# Patient Record
Sex: Female | Born: 1970 | Race: Black or African American | Hispanic: No | Marital: Married | Smoking: Never smoker
Health system: Southern US, Community
[De-identification: ages and names within clinical notes are randomized; demographics above are authoritative.]

## PROBLEM LIST (undated history)

## (undated) DIAGNOSIS — E119 Type 2 diabetes mellitus without complications: Secondary | ICD-10-CM

## (undated) DIAGNOSIS — L280 Lichen simplex chronicus: Secondary | ICD-10-CM

## (undated) DIAGNOSIS — Z794 Long term (current) use of insulin: Secondary | ICD-10-CM

## (undated) DIAGNOSIS — I10 Essential (primary) hypertension: Secondary | ICD-10-CM

## (undated) DIAGNOSIS — Z9641 Presence of insulin pump (external) (internal): Secondary | ICD-10-CM

## (undated) DIAGNOSIS — Z973 Presence of spectacles and contact lenses: Secondary | ICD-10-CM

## (undated) DIAGNOSIS — IMO0002 Reserved for concepts with insufficient information to code with codable children: Secondary | ICD-10-CM

## (undated) DIAGNOSIS — N840 Polyp of corpus uteri: Secondary | ICD-10-CM

## (undated) DIAGNOSIS — E282 Polycystic ovarian syndrome: Secondary | ICD-10-CM

## (undated) HISTORY — DX: Polycystic ovarian syndrome: E28.2

## (undated) HISTORY — DX: Essential (primary) hypertension: I10

## (undated) HISTORY — DX: Lichen simplex chronicus: L28.0

## (undated) HISTORY — DX: Reserved for concepts with insufficient information to code with codable children: IMO0002

---

## 1997-09-08 ENCOUNTER — Encounter: Admission: RE | Admit: 1997-09-08 | Discharge: 1997-12-07 | Payer: Self-pay | Admitting: Internal Medicine

## 1999-12-20 ENCOUNTER — Other Ambulatory Visit: Admission: RE | Admit: 1999-12-20 | Discharge: 1999-12-20 | Payer: Self-pay | Admitting: Gynecology

## 2000-01-23 ENCOUNTER — Other Ambulatory Visit: Admission: RE | Admit: 2000-01-23 | Discharge: 2000-01-23 | Payer: Self-pay | Admitting: Gynecology

## 2000-01-23 ENCOUNTER — Encounter (INDEPENDENT_AMBULATORY_CARE_PROVIDER_SITE_OTHER): Payer: Self-pay | Admitting: Specialist

## 2000-12-16 ENCOUNTER — Encounter: Admission: RE | Admit: 2000-12-16 | Discharge: 2001-03-16 | Payer: Self-pay | Admitting: Internal Medicine

## 2002-06-28 ENCOUNTER — Inpatient Hospital Stay (HOSPITAL_COMMUNITY): Admission: AD | Admit: 2002-06-28 | Discharge: 2002-06-28 | Payer: Self-pay | Admitting: *Deleted

## 2002-06-28 ENCOUNTER — Encounter (INDEPENDENT_AMBULATORY_CARE_PROVIDER_SITE_OTHER): Payer: Self-pay | Admitting: Specialist

## 2002-06-29 ENCOUNTER — Encounter: Payer: Self-pay | Admitting: *Deleted

## 2002-09-17 ENCOUNTER — Other Ambulatory Visit: Admission: RE | Admit: 2002-09-17 | Discharge: 2002-09-17 | Payer: Self-pay | Admitting: Obstetrics and Gynecology

## 2003-05-07 ENCOUNTER — Ambulatory Visit (HOSPITAL_COMMUNITY): Admission: RE | Admit: 2003-05-07 | Discharge: 2003-05-07 | Payer: Self-pay

## 2003-06-05 HISTORY — PX: MYOMECTOMY ABDOMINAL APPROACH: SUR870

## 2003-06-05 HISTORY — PX: HYSTEROSCOPY: SHX211

## 2003-09-22 ENCOUNTER — Encounter: Admission: RE | Admit: 2003-09-22 | Discharge: 2003-09-22 | Payer: Self-pay | Admitting: Endocrinology

## 2003-12-09 ENCOUNTER — Encounter: Admission: RE | Admit: 2003-12-09 | Discharge: 2003-12-09 | Payer: Self-pay | Admitting: Endocrinology

## 2004-06-14 ENCOUNTER — Other Ambulatory Visit: Admission: RE | Admit: 2004-06-14 | Discharge: 2004-06-14 | Payer: Self-pay | Admitting: Obstetrics and Gynecology

## 2005-01-01 ENCOUNTER — Other Ambulatory Visit: Admission: RE | Admit: 2005-01-01 | Discharge: 2005-01-01 | Payer: Self-pay | Admitting: Obstetrics and Gynecology

## 2005-05-21 ENCOUNTER — Inpatient Hospital Stay (HOSPITAL_COMMUNITY): Admission: RE | Admit: 2005-05-21 | Discharge: 2005-05-21 | Payer: Self-pay | Admitting: Gynecology

## 2005-05-23 ENCOUNTER — Inpatient Hospital Stay (HOSPITAL_COMMUNITY): Admission: RE | Admit: 2005-05-23 | Discharge: 2005-05-26 | Payer: Self-pay | Admitting: Gynecology

## 2005-05-23 ENCOUNTER — Encounter (INDEPENDENT_AMBULATORY_CARE_PROVIDER_SITE_OTHER): Payer: Self-pay | Admitting: Specialist

## 2005-05-27 ENCOUNTER — Encounter: Admission: RE | Admit: 2005-05-27 | Discharge: 2005-06-26 | Payer: Self-pay | Admitting: Gynecology

## 2005-07-10 ENCOUNTER — Other Ambulatory Visit: Admission: RE | Admit: 2005-07-10 | Discharge: 2005-07-10 | Payer: Self-pay | Admitting: Surgical Oncology

## 2006-07-25 ENCOUNTER — Other Ambulatory Visit: Admission: RE | Admit: 2006-07-25 | Discharge: 2006-07-25 | Payer: Self-pay | Admitting: Gynecology

## 2007-02-11 ENCOUNTER — Other Ambulatory Visit: Admission: RE | Admit: 2007-02-11 | Discharge: 2007-02-11 | Payer: Self-pay | Admitting: Gynecology

## 2007-12-01 ENCOUNTER — Other Ambulatory Visit: Admission: RE | Admit: 2007-12-01 | Discharge: 2007-12-01 | Payer: Self-pay | Admitting: Gynecology

## 2008-04-07 ENCOUNTER — Ambulatory Visit: Payer: Self-pay | Admitting: Internal Medicine

## 2008-04-07 DIAGNOSIS — I10 Essential (primary) hypertension: Secondary | ICD-10-CM | POA: Insufficient documentation

## 2008-04-07 DIAGNOSIS — R059 Cough, unspecified: Secondary | ICD-10-CM | POA: Insufficient documentation

## 2008-04-07 DIAGNOSIS — R05 Cough: Secondary | ICD-10-CM

## 2008-07-16 ENCOUNTER — Ambulatory Visit: Payer: Self-pay | Admitting: Gynecology

## 2008-07-27 ENCOUNTER — Ambulatory Visit: Payer: Self-pay | Admitting: Gynecology

## 2009-01-07 ENCOUNTER — Ambulatory Visit: Payer: Self-pay | Admitting: Gynecology

## 2009-01-24 ENCOUNTER — Ambulatory Visit: Payer: Self-pay | Admitting: Gynecology

## 2009-06-09 ENCOUNTER — Ambulatory Visit: Payer: Self-pay | Admitting: Gynecology

## 2009-06-09 ENCOUNTER — Other Ambulatory Visit: Admission: RE | Admit: 2009-06-09 | Discharge: 2009-06-09 | Payer: Self-pay | Admitting: Gynecology

## 2009-06-15 ENCOUNTER — Ambulatory Visit: Payer: Self-pay | Admitting: Gynecology

## 2010-05-09 ENCOUNTER — Ambulatory Visit: Payer: Self-pay | Admitting: Gynecology

## 2010-06-13 ENCOUNTER — Ambulatory Visit
Admission: RE | Admit: 2010-06-13 | Discharge: 2010-06-13 | Payer: Self-pay | Source: Home / Self Care | Attending: Gynecology | Admitting: Gynecology

## 2010-06-13 ENCOUNTER — Other Ambulatory Visit
Admission: RE | Admit: 2010-06-13 | Discharge: 2010-06-13 | Payer: Self-pay | Source: Home / Self Care | Admitting: Gynecology

## 2010-06-15 ENCOUNTER — Encounter
Admission: RE | Admit: 2010-06-15 | Discharge: 2010-06-15 | Payer: Self-pay | Source: Home / Self Care | Attending: Gynecology | Admitting: Gynecology

## 2010-10-20 NOTE — H&P (Signed)
Kiara Kim, Kim              ACCOUNT NO.:  [id number]   MEDICAL RECORD NO.:  [id number]          PATIENT TYPE:  INP   LOCATION:  9101                          FACILITY:  [facility]   PHYSICIAN:  Juan H. Lily Peer, M.D.DATE OF BIRTH:  [birth year]   DATE OF ADMISSION:  05/23/2005  DATE OF DISCHARGE:                                HISTORY & PHYSICAL   CHIEF COMPLAINT:  1.  Type 2 diabetic (insulin pump).  2.  Chronic hypertension.  3.  Prior uterine myomectomy.   HISTORY:  The patient is a 40 year old gravida 3 para 0 AB 2 at 37-and-a-  half weeks estimated gestational age who is scheduled to undergo a primary  cesarean section as a result of prior uterine myomectomy. The patient with  known type 2 diabetes before her pregnancy, has been on insulin pump and has  been followed locally by Dr. Talmage Nap, endocrinologist, with them adjusting her  insulin pump on a regular basis every week. The patient's also hypertension  has been monitored closely with 24-hour urine collections twice during her  pregnancy which she has had normal renal function tests and she has been  kept with normotensive blood pressures with Aldomet 500 mg in the morning  and 250 mg in the evening. The patient also has been followed by Kiara Kim where they have done level 3 scans as well as a fetal  echocardiogram which have all been normal. Interval growth has been  appropriate. The patient has been followed with antepartum testing  consisting of twice-a-week NSTs in the third trimester and AFI and interval  growth measurements every 2 weeks. She has also had reassuring fetal heart  rate tracings. On December 18, at 26 and two-sevenths weeks gestation, the  patient underwent an amniocentesis for fetal lung maturity in which the  report came back that it was positive for PG and had an L/S ratio over 3.0.   PAST MEDICAL HISTORY:  1.  The patient with type 2 diabetes prior to her pregnancy on insulin pump,      being  monitored closely by Dr. Talmage Nap, endocrinologist.  2.  The patient with chronic hypertension and on Aldomet 500 mg in the      morning and 250 mg in the evening.  3.  Prior myomectomy.  4.  The patient has had two prior miscarriages in 1999 and 2004      respectively.   REVIEW OF SYSTEMS:  See [place] form.   PHYSICAL EXAMINATION:  VITAL SIGNS:  The patient weighs approximately 234  pounds.  HEENT:  Unremarkable.  NECK:  Supple, trachea midline. No carotid bruits, no thyromegaly.  LUNGS:  Clear to auscultation without rhonchi or wheezes.  HEART:  Regular rate and rhythm, no murmurs or gallops.  BREASTS:  Exam not done.  ABDOMEN:  Gravid uterus, fundal height approximately 38 cm, vertex  presentation by Thayer Ohm maneuver.  PELVIC:  Cervix long, closed, and posterior.  EXTREMITIES:  DTRs 1+, negative clonus, trace edema.   PRENATAL LABORATORY DATA:  O positive blood type, negative antibody screen.  VDRL was nonreactive. Rubella immune. Hepatitis B  surface antigen and HIV  were negative. GBS culture was negative. The patient declined maternal serum  alpha-fetoprotein and cystic fibrosis screen.   ASSESSMENT:  A 40 year old gravida 3 para 0 AB 2 at 37-and-a-half weeks  estimated gestational age, type 2 diabetes on insulin pump. The patient also  with a history of chronic hypertension and prior myomectomy, scheduled for a  primary cesarean section on Wednesday, May 23, 2005, at 1 p.m. I have  spoken with Dr. Talmage Nap and since the patient's surgery is not scheduled until  the afternoon, she will adjust her basal rate and the patient will continue  on her insulin pump, and she will be followed with a Glucommander in the  hospital. The risks and benefits and pros and cons of the operation were  discussed with the patient in detail. All questions are answered and will  follow accordingly.   PLAN:  The patient scheduled for a primary cesarean section on Wednesday,  May 23, 2005,  at 1 p.m. at [facility].      Antietam H. Lily Peer, M.D.  Electronically Signed     JHF/MEDQ  D:  05/22/2005  T:  05/23/2005  Job:  [id number]

## 2010-10-20 NOTE — Op Note (Signed)
NAMEKATELEEN, ENCARNACION              ACCOUNT NO.:  [id number]   MEDICAL RECORD NO.:  [id number]            PATIENT TYPE:   LOCATION:                                 FACILITY:   PHYSICIAN:  Juan H. Lily Peer, M.D.     DATE OF BIRTH:   DATE OF PROCEDURE:  05/23/2005  DATE OF DISCHARGE:                                 OPERATIVE REPORT   SURGEON:  Juan H. Lily Peer, M.D.   FIRST ASSISTANT:  Timothy P. Fontaine, M.D.   INDICATIONS FOR OPERATION:  A 40 year old gravida 3, para 0, AB 2 at 33 and  [redacted] weeks gestation who underwent amniocentesis for fetal lung maturity on  December 18 which demonstrated an LS ratio greater than 3 and PG was  present. The patient with type 2 diabetes on insulin pump as well as chronic  hypertension on Aldomet. The patient also with prior myomectomy scheduled  for primary cesarean section.   PREOPERATIVE DIAGNOSIS:  1.  Term intrauterine pregnancy.  2.  Type 2 diabetes (on insulin pump).  3.  Chronic hypertension.  4.  Prior myomectomy.   POSTOPERATIVE DIAGNOSIS:  1.  Term intrauterine pregnancy.  2.  Type 2 diabetes (on insulin pump).  3.  Chronic hypertension.  4.  Prior myomectomy.   PROCEDURE PERFORMED:  Primary lower uterine segment transverse cesarean  section.   ANESTHESIA:  Was spinal.   FINDINGS:  Viable female infant, Apgars 7 and 8, clear amniotic fluid.  Weight 6 pounds 10 ounces. 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   DESCRIPTION OF OPERATION:  After the patient was adequately counseled, she  was taken to the operating room where she underwent successful spinal  anesthesia and her abdomen was prepped and draped in the usual sterile  fashion. A Foley catheter had been inserted in effort to monitor urinary  output.  Afterward the abdomen was prepped and draped in usual sterile  fashion. The Pfannenstiel skin incision was made. Incision was made through  the skin, subcutaneous tissue down to the rectus fascia  whereby midline nick  was made. The peritoneal cavity was entered cautiously. The bladder flap was  established and the lower uterine segment was incised in transverse fashion.  Clear amniotic fluid was present. Due to her intramural fibroids the  transverse incision had been extended and required utilization of the vacuum  extractor to help deliver the newborn. Once this accomplished there was cord  around the neck which was manually reduced. Nasopharyngeal area was bulb  suctioned. The cord was clamped, cut and newborn gave a cry and was shown to  the parents and passed off the neonatologist who were in attendance and gave  the above-mentioned parameters. After cord blood was obtained the placenta  and cord were passed off the operative fields. The patient was donating cord  blood to the public cord blood banking. Pitocin drip was started. The  patient received a gram of Ancef. The uterus was unable to be exteriorized.  The intrauterine cavity was swept clear of remaining products of conception  and the intrauterine cavity was swept clear  of remaining products of  conception and closed in a single locking stitch with 0 Vicryl suture and  second layer in an imbricating manner with 0 Vicryl suture as well. Both  tubes and ovaries appeared normal. The visceroperitoneum was not  reapproximated. After ascertaining adequate hemostasis. The rectus fascia  was then closed with running stitch of 0 Vicryl suture. The subcutaneous  bleeders were Bovie cauterized. The skin was reapproximated with skin clips  followed by placing Xeroform gauze and 4 x 8 dressing. 30 mL of 0.25%  Marcaine were infiltrated subcutaneously for postoperative analgesia. After  the staples were in place, Xeroform gauze and 4 x 8 pressure dressing was  applied. The patient was transferred to recovery room with stable vital  signs.  Blood loss for the procedure was 1200 mL. IV fluids 2500 mL of  lactated Ringer's and urine  output 200 mL and clear.      Juan H. Lily Peer, M.D.  Electronically Signed     JHF/MEDQ  D:  05/23/2005  T:  05/24/2005  Job:  [id number]

## 2010-10-20 NOTE — Discharge Summary (Signed)
Kiara Kim, Kiara Kim              ACCOUNT NO.:  [id number]   MEDICAL RECORD NO.:  [id number]          PATIENT TYPE:  INP   LOCATION:  9101                          FACILITY:  [facility]   PHYSICIAN:  Juan H. Lily Peer, M.D.DATE OF BIRTH:  [birth year]   DATE OF ADMISSION:  05/23/2005  DATE OF DISCHARGE:  05/26/2005                                 DISCHARGE SUMMARY   TOTAL DAYS HOSPITALIZED:  Three.   HISTORY:  The patient is a 40 year old, gravida 3, para 0, AB 2, with 37-1/[redacted]  weeks gestation after having a few days prior an amniocentesis of fetal lung  maturity, demonstrating lung maturity, was scheduled for a primary cesarean  section.  The patient underwent a primary lower uterine segment transverse  cesarean section December 20 secondary to history of prior uterine  myomectomy. The patient with known type 2 diabetes on insulin pump,  carefully monitored by endocrinologist, Dr. Talmage Nap.  The patient also had had  chronic hypertension and been on Aldomet 500 mg in the morning, 250 mg in  the evening.  The patient intraoperatively did well, delivered a viable  female infant, Apgars 7 and 8, 6 pounds and 10 ounces.  The patient had been  on Glucommander protocol for blood sugars and was also followed closely by  Dr. Talmage Nap, endocrinologist, to maintain her blood sugars in safe range.  Her  first postoperative day, her blood type was 0 positive. She was rubella  immune.  Her blood pressure had fluctuated, the highest 150-170's systolic.  Diastolic 99-107.  Her hemoglobin was 10.9, hematocrit 32.1, platelet count  251,000 that morning.  Blood sugar at 4 a.m. was 62.  At 7 a.m. was 111 and  at 8 a.m. was 82.  We continued to monitor her blood pressures closely. On  her second postoperative day, she continued to do well.  Her third  postoperative day her blood pressure was 148/95 and 147/86 respectively.  Of  note, her Foley catheter was removed after 24 hours.  Her diet was increased  from clear  liquids to regular diet.  She had been up and ambulating, voiding  well and had been in contact with Dr. Talmage Nap for adjustments of her insulin  pump and was ready to be discharged home.  Her staple was removed and her  incision was steri-stripped.   DISCHARGE DIAGNOSES:  1.  Term intrauterine pregnancy.  2.  Type 2 diabetic (insulin pump).  3.  Chronic hypertension.  4.  Prior uterine myomectomy.  5.  Surgical anemia.   OPERATION/PROCEDURE:  Primary lower uterine segment transverse cesarean  section.   FINAL DISPOSITION:  The patient was discharged home on the third  postoperative day after her staples were removed.  Her incision was steri-  stripped.  She was to continue to follow up with Dr. Talmage Nap, her  endocrinologist, who continued to monitor her blood sugars and adjust her  insulin pump accordingly. She was increased on her Aldomet to maintain her  blood pressure control and she was discharged home with prescription for  Aldomet now 500 mg b.i.d.  Parameters were set in which  to contact the  office of Dr. Talmage Nap or ourselves.  She was instructed to  continue her prenatal vitamins.  She was given a prescription for Lortab  7.5/500 to take one p.o. q.4-6h. p.r.n. pain and Motrin 800 mg t.i.d. p.r.n.  She was instructed to return back to the office in six weeks for a  postpartum visit and __________ instructions were provided.      Juan H. Lily Peer, M.D.  Electronically Signed     JHF/MEDQ  D:  06/08/2005  T:  06/08/2005  Job:  [id number]

## 2011-03-29 ENCOUNTER — Encounter: Payer: Self-pay | Admitting: Women's Health

## 2011-03-29 ENCOUNTER — Ambulatory Visit (INDEPENDENT_AMBULATORY_CARE_PROVIDER_SITE_OTHER): Payer: BC Managed Care – PPO | Admitting: Women's Health

## 2011-03-29 VITALS — BP 130/70

## 2011-03-29 DIAGNOSIS — L739 Follicular disorder, unspecified: Secondary | ICD-10-CM

## 2011-03-29 DIAGNOSIS — E282 Polycystic ovarian syndrome: Secondary | ICD-10-CM | POA: Insufficient documentation

## 2011-03-29 DIAGNOSIS — L738 Other specified follicular disorders: Secondary | ICD-10-CM

## 2011-03-29 MED ORDER — FLUCONAZOLE 150 MG PO TABS: 150.0000 mg | ORAL_TABLET | Freq: Once | ORAL | Status: AC

## 2011-03-29 MED ORDER — SULFAMETHOXAZOLE-TRIMETHOPRIM 800-160 MG PO TABS: 1.0000 | ORAL_TABLET | Freq: Two times a day (BID) | ORAL | Status: AC

## 2011-03-29 NOTE — Progress Notes (Signed)
  Presents with a complaint of a bump on her left labia for 2 days. Denies fever, discharge, or UTI symptoms. Type II diabetic with insulin pump, last hemoglobin A1c was 6 and she states her blood sugars have been normal.  Exam: Left labial 3- 4 cm indurated folliculitis .  Plan: Area cleansed with an alcohol swab, 18-gauge needle inserted to head, large amount of purulent drainage exuded with minimal pressure, no odor, patient felt relief, Triple Antibiotic ointment applied. Septra DS twice a day for 7 days, Diflucan 150 by mouth x1 dose if yeast symptoms. Encouraged warm soaks twice daily, keep clean and dry, loose clothes. Instructed to return to office next week to check for resolution, but states will be on vacation out of town. Will call if not 100% resolved.

## 2011-04-18 ENCOUNTER — Telehealth: Payer: Self-pay | Admitting: *Deleted

## 2011-04-18 NOTE — Telephone Encounter (Signed)
Pt called stating she passed a pretty big clot this am when her cycle started. After passing clot pt had cramping. She is not passed any more clots since then and her cramping is controlled by tylenol. Pt informed to monitor for now and she will follow up if another clot should pass that size.

## 2011-05-11 ENCOUNTER — Other Ambulatory Visit: Payer: Self-pay | Admitting: Gynecology

## 2011-05-11 ENCOUNTER — Telehealth: Payer: Self-pay | Admitting: *Deleted

## 2011-05-11 ENCOUNTER — Encounter: Payer: Self-pay | Admitting: Gynecology

## 2011-05-11 ENCOUNTER — Ambulatory Visit (INDEPENDENT_AMBULATORY_CARE_PROVIDER_SITE_OTHER): Payer: BC Managed Care – PPO | Admitting: Gynecology

## 2011-05-11 VITALS — BP 124/82

## 2011-05-11 DIAGNOSIS — L03317 Cellulitis of buttock: Secondary | ICD-10-CM

## 2011-05-11 DIAGNOSIS — B373 Candidiasis of vulva and vagina: Secondary | ICD-10-CM

## 2011-05-11 DIAGNOSIS — N898 Other specified noninflammatory disorders of vagina: Secondary | ICD-10-CM

## 2011-05-11 DIAGNOSIS — B3731 Acute candidiasis of vulva and vagina: Secondary | ICD-10-CM

## 2011-05-11 DIAGNOSIS — L0231 Cutaneous abscess of buttock: Secondary | ICD-10-CM

## 2011-05-11 MED ORDER — OXYCODONE-ACETAMINOPHEN 5-500 MG PO CAPS: 1.0000 | ORAL_CAPSULE | ORAL | Status: AC | PRN

## 2011-05-11 MED ORDER — FLUCONAZOLE 150 MG PO TABS: 150.0000 mg | ORAL_TABLET | Freq: Once | ORAL | Status: AC

## 2011-05-11 MED ORDER — DOXYCYCLINE HYCLATE 50 MG PO CAPS: 100.0000 mg | ORAL_CAPSULE | Freq: Two times a day (BID) | ORAL | Status: AC

## 2011-05-11 MED ORDER — MUPIROCIN CALCIUM 2 % EX CREA: TOPICAL_CREAM | Freq: Three times a day (TID) | CUTANEOUS | Status: AC

## 2011-05-11 NOTE — Telephone Encounter (Signed)
Pt called c/o boil in her anus area x 4 days now and seems to get bigger. Pt also c/o yeast infection as well. Pt informed to make office visit.

## 2011-05-11 NOTE — Progress Notes (Signed)
Addended by: Landis Martins R on: 05/11/2011 02:19 PM   Modules accepted: Orders

## 2011-05-11 NOTE — Progress Notes (Signed)
Patient presented to the office today complaining of a raised area in her upper buttocks tender red. Patient is an insulin-dependent diabetic on insulin pump. In October this year she was treated for vulvar boil. On examination the following was found:   Physical Exam  Skin:      Patient was counseled for an I&D of suspected abscess. The area was cleansed with Betadine solution. 1% lidocaine was infiltrated subcutaneously a small stab incision was made and a small amount of currently material was extruded and cultures were obtained for MRSA. The loculations were broken down with a curved sterile hemostat and the area was copiously irrigated with normal saline solution and a small wick of gauze was placed into the defect which she will remove tomorrow. She will be placed on Vibramycin 100 mg twice a day for 2 weeks. She will use antibacterial soap to cleanse the area nightly. And she will apply Bactroban cream twice a day for 2 weeks. And she'll return to the office in 2 weeks for a followup visit. She was given a prescription and Tylox to take one by mouth every 4-6 hours when necessary. She also had yeast infection and a prescription for Diflucan 150 mg was provided.

## 2011-05-11 NOTE — Patient Instructions (Signed)
Hassell Done, prescriptions rated be picked up at the pharmacy. Once the antibiotic call Vibramycin that wants to take 1 tablet twice a day. The other wounds to Bactroban antibiotic cream which apply to 3 times a day. Remember to clean the area over a well with antibacterial soap. I did call you also for an antifungal agent for your yeast infections well. Remember to remove the small gauze from the area in the back. You can put a Band-Aid over to keep it clean during the day. For Tylox is for pain or remember you should not drive while taking this medication.

## 2011-05-18 LAB — WOUND CULTURE: Gram Stain: NONE SEEN

## 2011-05-25 ENCOUNTER — Encounter: Payer: Self-pay | Admitting: Gynecology

## 2011-05-25 ENCOUNTER — Ambulatory Visit (INDEPENDENT_AMBULATORY_CARE_PROVIDER_SITE_OTHER): Payer: BC Managed Care – PPO | Admitting: Gynecology

## 2011-05-25 VITALS — BP 134/86

## 2011-05-25 DIAGNOSIS — L0291 Cutaneous abscess, unspecified: Secondary | ICD-10-CM

## 2011-05-25 NOTE — Progress Notes (Signed)
Patient presented to the office today she is status post I&D of a suspected abscess in the upper buttock. She is doing well and today will be last data she is on Vibramycin which she was taken 100 mg twice a day for a two-week course along with the application of Bactroban antibiotic cream twice a day along with washing the area with antibacterial soap daily.  Wound culture grew a Staphylococcus aureus sensitive to Vibramycin.  Exam: Area completely healed nonerythematous nontender.  Patient is a diabetic on insulin pump we discussed preventive measures to prevent skin infections in the future but most and poorly she is to maintain her sugars under control.  Patient will return back next month which is due for her annual exam.

## 2011-05-25 NOTE — Patient Instructions (Signed)
See you next month. Merry Christmas

## 2011-06-19 ENCOUNTER — Encounter: Payer: Self-pay | Admitting: Gynecology

## 2011-06-19 ENCOUNTER — Other Ambulatory Visit (HOSPITAL_COMMUNITY)
Admission: RE | Admit: 2011-06-19 | Discharge: 2011-06-19 | Disposition: A | Discharge status: Home / Self Care | Payer: 59 | Source: Ambulatory Visit | Attending: Gynecology | Admitting: Gynecology

## 2011-06-19 ENCOUNTER — Ambulatory Visit (INDEPENDENT_AMBULATORY_CARE_PROVIDER_SITE_OTHER): Payer: 59 | Admitting: Gynecology

## 2011-06-19 VITALS — BP 140/90 | Ht 68.0 in | Wt 199.0 lb

## 2011-06-19 DIAGNOSIS — R6882 Decreased libido: Secondary | ICD-10-CM

## 2011-06-19 DIAGNOSIS — Z01419 Encounter for gynecological examination (general) (routine) without abnormal findings: Secondary | ICD-10-CM | POA: Insufficient documentation

## 2011-06-19 DIAGNOSIS — N760 Acute vaginitis: Secondary | ICD-10-CM

## 2011-06-19 DIAGNOSIS — N393 Stress incontinence (female) (male): Secondary | ICD-10-CM

## 2011-06-19 LAB — WET PREP FOR TRICH, YEAST, CLUE

## 2011-06-19 MED ORDER — FLUCONAZOLE 150 MG PO TABS: 150.0000 mg | ORAL_TABLET | Freq: Once | ORAL | Status: AC

## 2011-06-19 NOTE — Patient Instructions (Signed)
Please read information on pelvic floor exercises

## 2011-06-19 NOTE — Progress Notes (Signed)
Kiara Kim 07/31/1970 [id number]   History:    41 y.o.  for annual exam who has a history of type 2 diabetes on insulin pump. She has been followed by the endocrinologist Dr. Lurene Shadow. Lab work drawn less than 6 months ago. Patient been complaining of some decreased libido and occasional incontinence when sneezing. She has lost 11 pounds is last year. She does her monthly self breast examination. Had a baseline mammogram at 37. Mother with history of breast cancer in her 58s. Patient having normal menstrual cycles not using any form of contraception. Husband with history of female factor infertility and has had a varicocele repair in the past and has an absent right testes, and low sperm count. Patient's only pregnancy was successful the ovulation induction medication and sperm washing and IUI. Husband has been followed by Dr. Patsi Sears urologist.  Past medical history,surgical history, family history and social history were all reviewed and documented in the EPIC chart.  Gynecologic History Patient's last menstrual period was 05/21/2011. Contraception: none Last Pap: 2012. Results were: normal Last mammogram: Age 52. Results were: normal  Obstetric History OB History    Grav Para Term Preterm Abortions TAB SAB Ect Mult Living   3 1 1  2  2   1      # Outc Date GA Lbr Len/2nd Wgt Sex Del Anes PTL Lv   1 TRM     F CS  No Yes   2 SAB            3 SAB                ROS:  Was performed and pertinent positives and negatives are included in the history.  Exam: chaperone present  BP 140/90  Ht 5\' 8"  (1.727 m)  Wt 199 lb (90.266 kg)  BMI 30.26 kg/m2  LMP 05/21/2011  Body mass index is 30.26 kg/(m^2).  General appearance : Well developed well nourished female. No acute distress HEENT: Neck supple, trachea midline, no carotid bruits, no thyroidmegaly Lungs: Clear to auscultation, no rhonchi or wheezes, or rib retractions  Heart: Regular rate and rhythm, no murmurs or  gallops Breast:Examined in sitting and supine position were symmetrical in appearance, no palpable masses or tenderness,  no skin retraction, no nipple inversion, no nipple discharge, no skin discoloration, no axillary or supraclavicular lymphadenopathy Abdomen: no palpable masses or tenderness, no rebound or guarding Extremities: no edema or skin discoloration or tenderness  Pelvic:  Bartholin, Urethra, Skene Glands: Within normal limits             Vagina: No gross lesions or discharge  Cervix: No gross lesions or discharge  Uterus  anteverted, normal size, shape and consistency, non-tender and mobile  Adnexa  Without masses or tenderness  Anus and perineum  normal   Rectovaginal  normal sphincter tone without palpated masses or tenderness             Hemoccult not done     Assessment/Plan:  41 y.o. female for annual exam unremarkable. Slight white discharge was noted to wet prep was done moniliasis was noted. Prescription for Diflucan will be provided. She has a followup plum of the endocrinologist in March of the remainder her lab work so no lab work was done today. Pap smear was done today. Literature information on Kegel exercises was provided. We'll continue to monitor symptoms patient with good pelvic support otherwise dry most of the day and night. Patient flu vaccine was administered this  year. She is encouraged her monthly self breast examination. Next mammogram at age 49. Her slight decreased libido may be a result of her combination of antihypertensive medications.    Ok Edwards MD, 10:18 AM 06/19/2011

## 2011-09-01 ENCOUNTER — Other Ambulatory Visit: Payer: Self-pay | Admitting: Gynecology

## 2011-12-18 IMAGING — MG MM DIGITAL SCREENING
4 series · 4 of 4 positions shown · non-contrast
Comparison: none

DG SCREEN MAMMOGRAM BILATERAL
Bilateral CC and MLO view(s) were taken.

DIGITAL SCREENING MAMMOGRAM WITH CAD:
There are scattered fibroglandular densities.  No masses or malignant type calcifications are 
identified.  Compared with prior studies.
Images were processed with CAD.

[R CC]
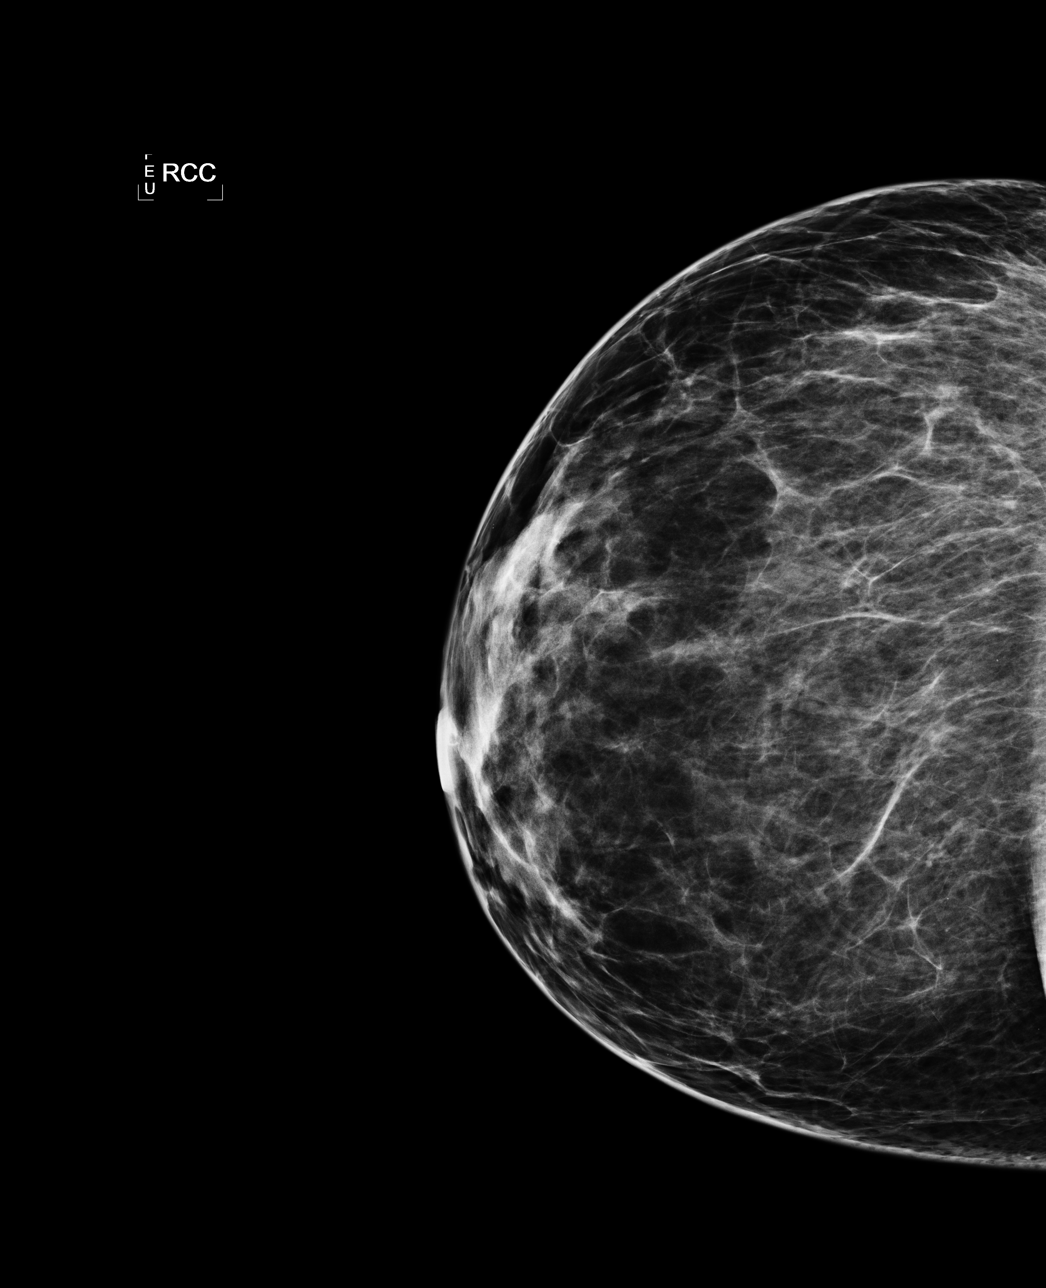

[L CC]
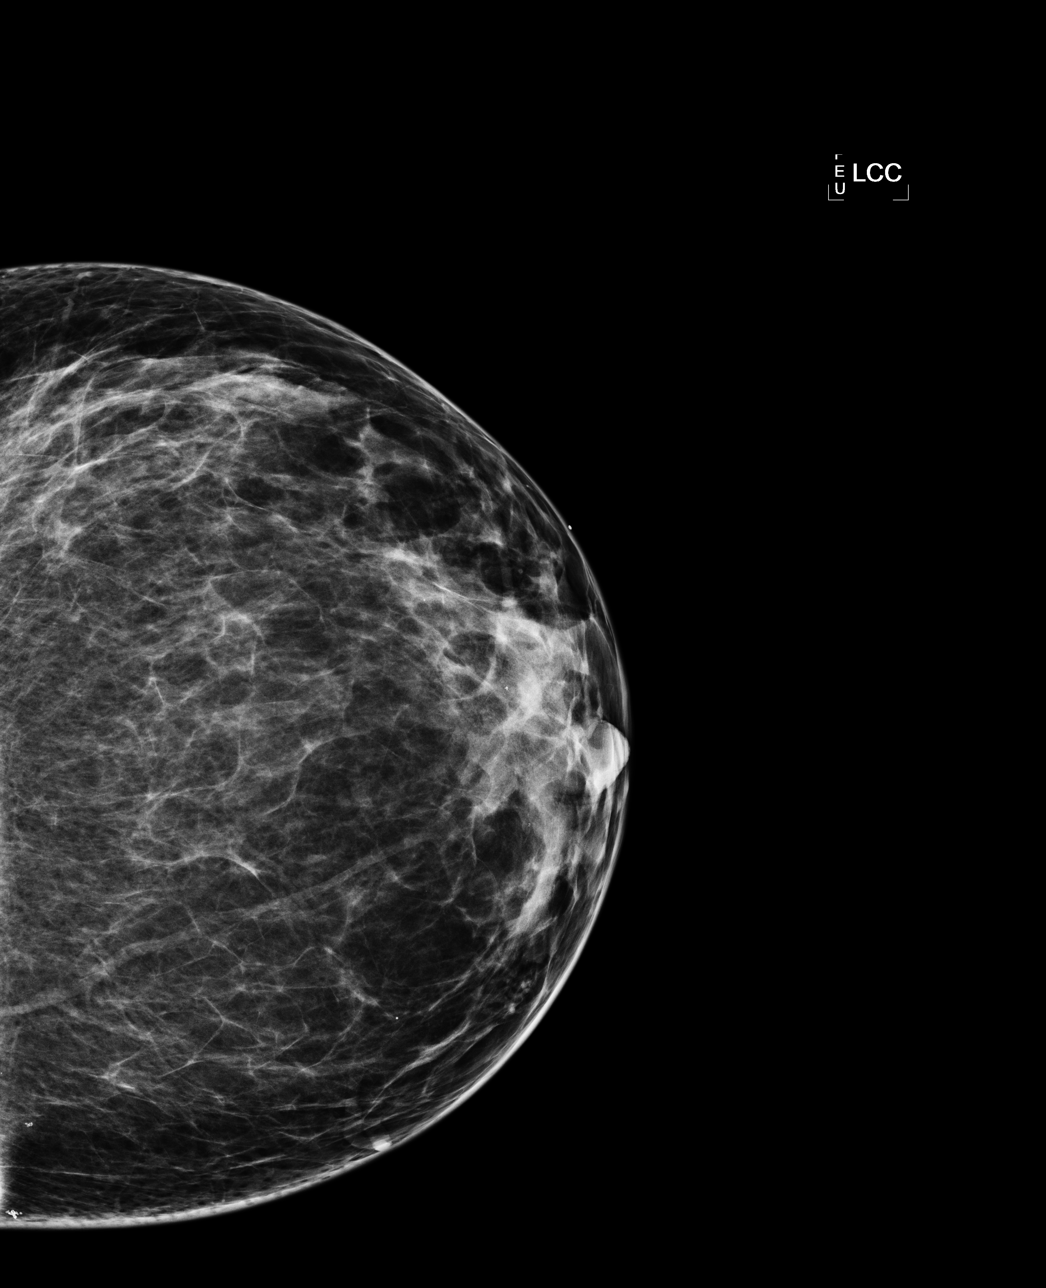

[L MLO]
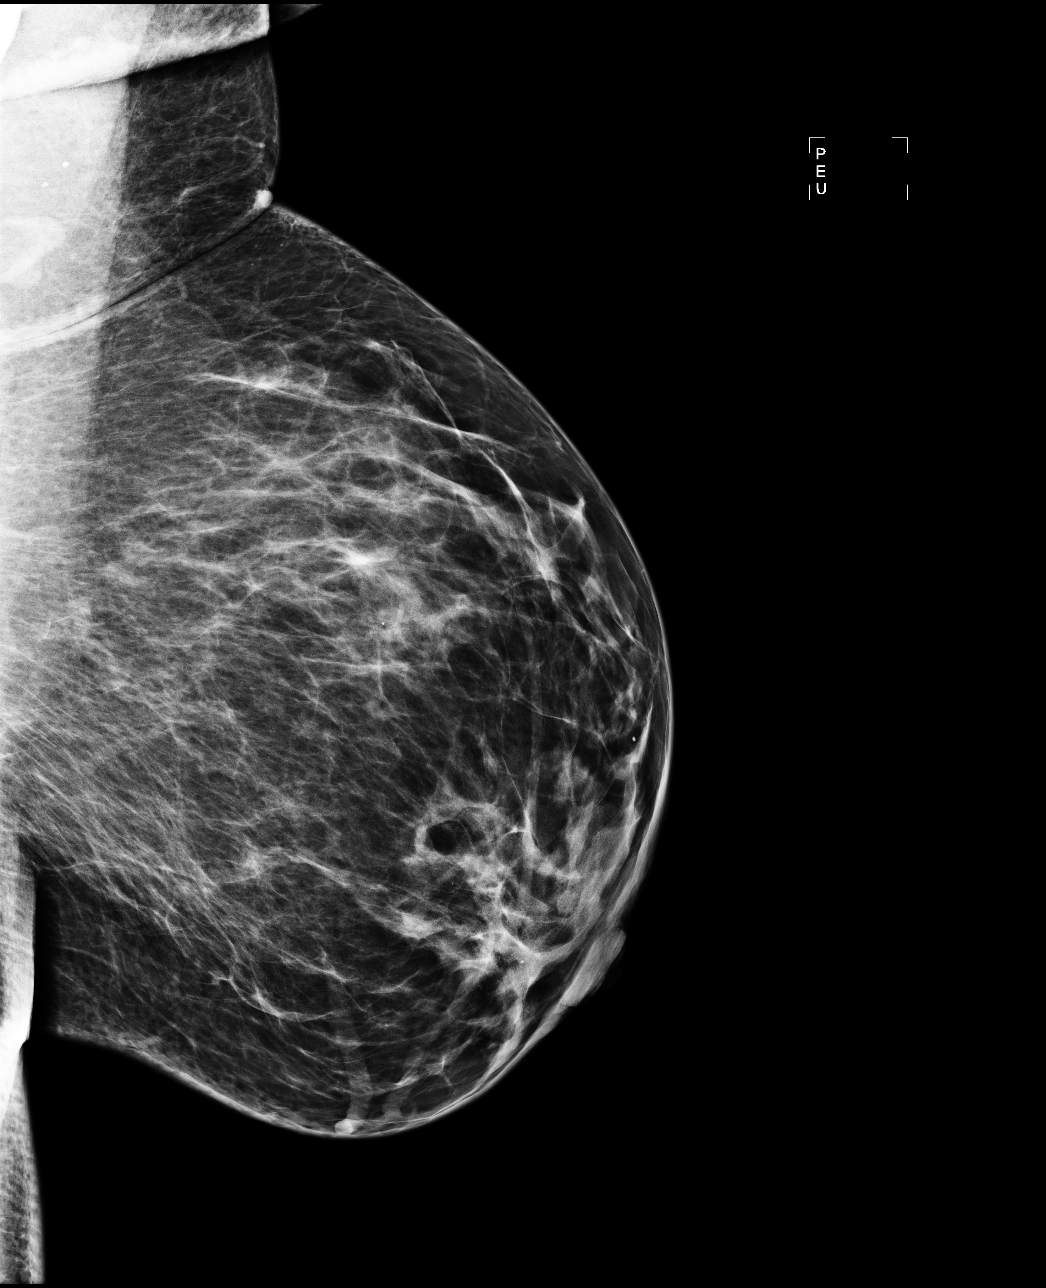

[R MLO]
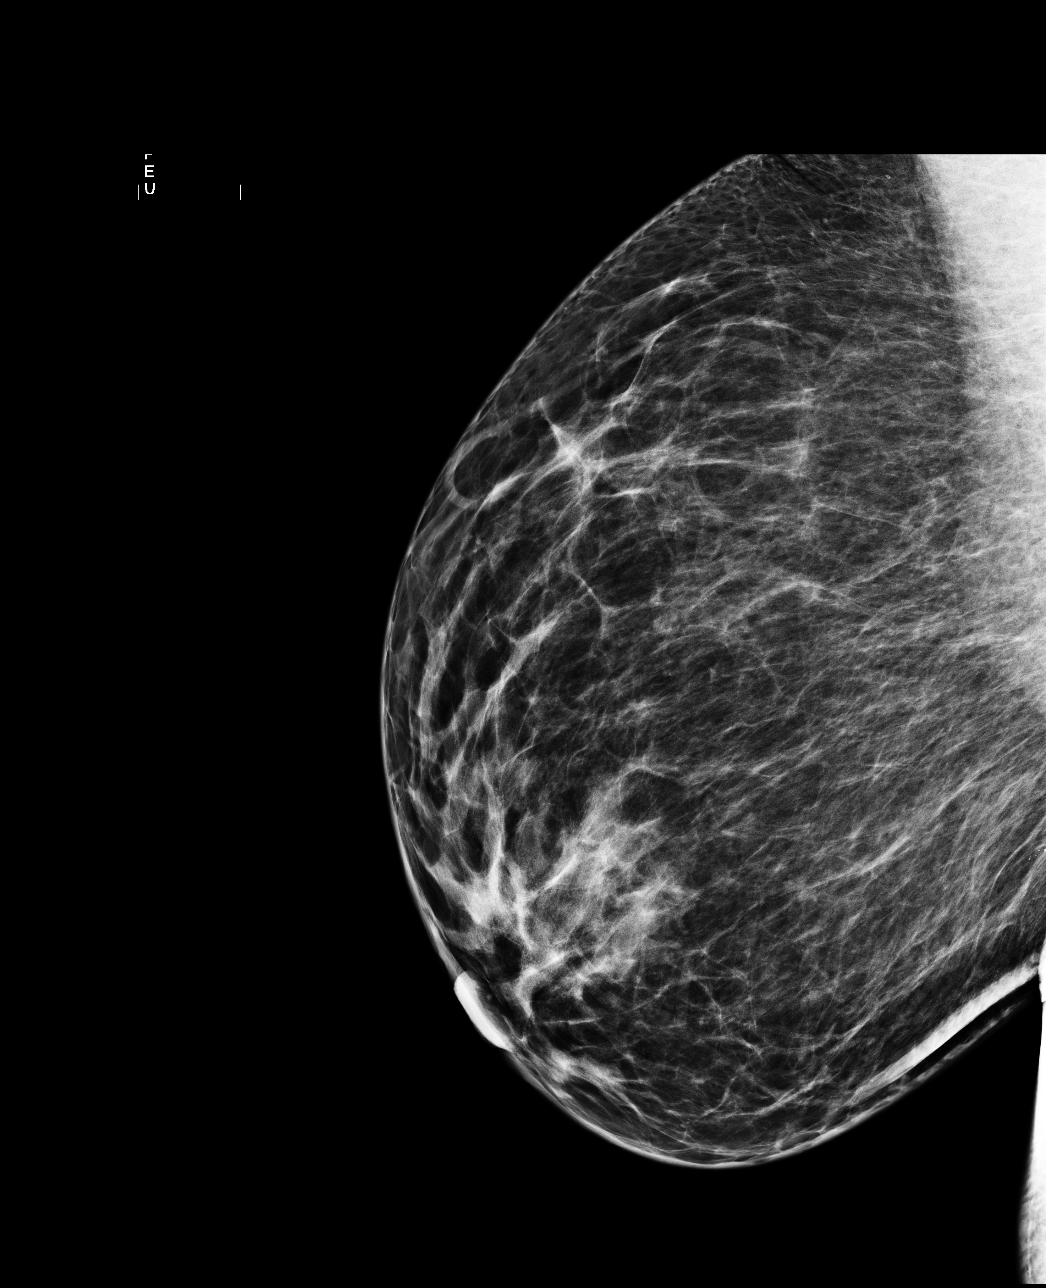

[4 of 4 positions shown; findings below may reference images not displayed]

IMPRESSION: No specific mammographic evidence of malignancy.  Next screening mammogram is recommended at age 
42.

A result letter of this screening mammogram will be mailed directly to the patient.

ASSESSMENT: Negative - BI-RADS 1

Screening mammogram at age 42.
,

## 2011-12-19 ENCOUNTER — Telehealth: Payer: Self-pay | Admitting: *Deleted

## 2011-12-19 NOTE — Telephone Encounter (Signed)
Patient called to say she has had a rash between legs and vaginal area for over a week.  Tried otc creams and ointments without relief.  Patient informed to come in for ov to eval.

## 2011-12-20 ENCOUNTER — Encounter: Payer: Self-pay | Admitting: *Deleted

## 2011-12-20 ENCOUNTER — Ambulatory Visit (INDEPENDENT_AMBULATORY_CARE_PROVIDER_SITE_OTHER): Payer: 59 | Admitting: Gynecology

## 2011-12-20 ENCOUNTER — Encounter: Payer: Self-pay | Admitting: Gynecology

## 2011-12-20 VITALS — BP 128/86

## 2011-12-20 DIAGNOSIS — B373 Candidiasis of vulva and vagina: Secondary | ICD-10-CM

## 2011-12-20 DIAGNOSIS — N898 Other specified noninflammatory disorders of vagina: Secondary | ICD-10-CM

## 2011-12-20 DIAGNOSIS — B9689 Other specified bacterial agents as the cause of diseases classified elsewhere: Secondary | ICD-10-CM

## 2011-12-20 DIAGNOSIS — N760 Acute vaginitis: Secondary | ICD-10-CM

## 2011-12-20 DIAGNOSIS — A499 Bacterial infection, unspecified: Secondary | ICD-10-CM

## 2011-12-20 DIAGNOSIS — B3731 Acute candidiasis of vulva and vagina: Secondary | ICD-10-CM

## 2011-12-20 LAB — WET PREP FOR TRICH, YEAST, CLUE: Trich, Wet Prep: NONE SEEN

## 2011-12-20 MED ORDER — FLUCONAZOLE 100 MG PO TABS: 100.0000 mg | ORAL_TABLET | Freq: Every day | ORAL | Status: AC

## 2011-12-20 MED ORDER — CLINDAMYCIN PHOSPHATE 2 % VA CREA: TOPICAL_CREAM | VAGINAL | Status: DC

## 2011-12-20 NOTE — Progress Notes (Signed)
Kiara Kim is a 41 year old who presented to the office today complaining of vulvovaginal irritation and pruritus. Kiara Kim is a diabetic on insulin pump and is being followed by Dr. Lurene Shadow her endocrinologist. She tried an over-the-counter antifungal for one day with no resolution of her symptoms. Her last menstrual period was June 15 2 Skene and rated start her menses.  Exam: External genitalia and perineum erythematous areas from excoriation and from scratching. Vagina: White thick discharge and foul odor was noted vaginal mucosa very erythematous.  Wet prep: Moderate amount of yeast, moderate white blood cells, 2 years ago bacteria and clue cells present along with positive Amine  Assessment/plan: Kiara Kim with material vaginosis will be placed on Cleocin vaginal cream to apply each bedtime for 5 days. She will also be given a prescription of Diflucan 150 mg to take 1 by mouth every other day for 3 days.

## 2011-12-20 NOTE — Patient Instructions (Signed)
Monilial Vaginitis Vaginitis in a soreness, swelling and redness (inflammation) of the vagina and vulva. Monilial vaginitis is not a sexually transmitted infection. CAUSES  Yeast vaginitis is caused by yeast (candida) that is normally found in your vagina. With a yeast infection, the candida has overgrown in number to a point that upsets the chemical balance. SYMPTOMS   White, thick vaginal discharge.   Swelling, itching, redness and irritation of the vagina and possibly the lips of the vagina (vulva).   Burning or painful urination.   Painful intercourse.  DIAGNOSIS  Things that may contribute to monilial vaginitis are:  Postmenopausal and virginal states.   Pregnancy.   Infections.   Being tired, sick or stressed, especially if you had monilial vaginitis in the past.   Diabetes. Good control will help lower the chance.   Birth control pills.   Tight fitting garments.   Using bubble bath, feminine sprays, douches or deodorant tampons.   Taking certain medications that kill germs (antibiotics).   Sporadic recurrence can occur if you become ill.  TREATMENT  Your caregiver will give you medication.  There are several kinds of anti monilial vaginal creams and suppositories specific for monilial vaginitis. For recurrent yeast infections, use a suppository or cream in the vagina 2 times a week, or as directed.   Anti-monilial or steroid cream for the itching or irritation of the vulva may also be used. Get your caregiver's permission.   Painting the vagina with methylene blue solution may help if the monilial cream does not work.   Eating yogurt may help prevent monilial vaginitis.  HOME CARE INSTRUCTIONS   Finish all medication as prescribed.   Do not have sex until treatment is completed or after your caregiver tells you it is okay.   Take warm sitz baths.   Do not douche.   Do not use tampons, especially scented ones.   Wear cotton underwear.   Avoid tight  pants and panty hose.   Tell your sexual partner that you have a yeast infection. They should go to their caregiver if they have symptoms such as mild rash or itching.   Your sexual partner should be treated as well if your infection is difficult to eliminate.   Practice safer sex. Use condoms.   Some vaginal medications cause latex condoms to fail. Vaginal medications that harm condoms are:   Cleocin cream.   Butoconazole (Femstat).   Terconazole (Terazol) vaginal suppository.   Miconazole (Monistat) (may be purchased over the counter).  SEEK MEDICAL CARE IF:   You have a temperature by mouth above 102 F (38.9 C).   The infection is getting worse after 2 days of treatment.   The infection is not getting better after 3 days of treatment.   You develop blisters in or around your vagina.   You develop vaginal bleeding, and it is not your menstrual period.   You have pain when you urinate.   You develop intestinal problems.   You have pain with sexual intercourse.  Document Released: 02/28/2005 Document Revised: 05/10/2011 Document Reviewed: 11/12/2008 ExitCare Patient Information 2012 [employer].Bacterial Vaginosis Bacterial vaginosis (BV) is a vaginal infection where the normal balance of bacteria in the vagina is disrupted. The normal balance is then replaced by an overgrowth of certain bacteria. There are several different kinds of bacteria that can cause BV. BV is the most common vaginal infection in women of childbearing age. CAUSES   The cause of BV is not fully understood. BV develops   when there is an increase or imbalance of harmful bacteria.   Some activities or behaviors can upset the normal balance of bacteria in the vagina and put women at increased risk including:   Having a new sex partner or multiple sex partners.   Douching.   Using an intrauterine device (IUD) for contraception.   It is not clear what role sexual activity plays in the  development of BV. However, women that have never had sexual intercourse are rarely infected with BV.  Women do not get BV from toilet seats, bedding, swimming pools or from touching objects around them.  SYMPTOMS   Grey vaginal discharge.   A fish-like odor with discharge, especially after sexual intercourse.   Itching or burning of the vagina and vulva.   Burning or pain with urination.   Some women have no signs or symptoms at all.  DIAGNOSIS  Your caregiver must examine the vagina for signs of BV. Your caregiver will perform lab tests and look at the sample of vaginal fluid through a microscope. They will look for bacteria and abnormal cells (clue cells), a pH test higher than 4.5, and a positive amine test all associated with BV.  RISKS AND COMPLICATIONS   Pelvic inflammatory disease (PID).   Infections following gynecology surgery.   Developing HIV.   Developing herpes virus.  TREATMENT  Sometimes BV will clear up without treatment. However, all women with symptoms of BV should be treated to avoid complications, especially if gynecology surgery is planned. Female partners generally do not need to be treated. However, BV may spread between female sex partners so treatment is helpful in preventing a recurrence of BV.   BV may be treated with antibiotics. The antibiotics come in either pill or vaginal cream forms. Either can be used with nonpregnant or pregnant women, but the recommended dosages differ. These antibiotics are not harmful to the baby.   BV can recur after treatment. If this happens, a second round of antibiotics will often be prescribed.   Treatment is important for pregnant women. If not treated, BV can cause a premature delivery, especially for a pregnant woman who had a premature birth in the past. All pregnant women who have symptoms of BV should be checked and treated.   For chronic reoccurrence of BV, treatment with a type of prescribed gel vaginally twice a  week is helpful.  HOME CARE INSTRUCTIONS   Finish all medication as directed by your caregiver.   Do not have sex until treatment is completed.   Tell your sexual partner that you have a vaginal infection. They should see their caregiver and be treated if they have problems, such as a mild rash or itching.   Practice safe sex. Use condoms. Only have 1 sex partner.  PREVENTION  Basic prevention steps can help reduce the risk of upsetting the natural balance of bacteria in the vagina and developing BV:  Do not have sexual intercourse (be abstinent).   Do not douche.   Use all of the medicine prescribed for treatment of BV, even if the signs and symptoms go away.   Tell your sex partner if you have BV. That way, they can be treated, if needed, to prevent reoccurrence.  SEEK MEDICAL CARE IF:   Your symptoms are not improving after 3 days of treatment.   You have increased discharge, pain, or fever.  MAKE SURE YOU:   Understand these instructions.   Will watch your condition.   Will get help   right away if you are not doing well or get worse.  FOR MORE INFORMATION  Division of STD Prevention (DSTDP), Centers for Disease Control and Prevention: www.cdc.gov/std American Social Health Association (ASHA): www.ashastd.org  Document Released: 05/21/2005 Document Revised: 05/10/2011 Document Reviewed: 11/11/2008 ExitCare Patient Information 2012 [employer]Care[employer]. 

## 2011-12-20 NOTE — Progress Notes (Signed)
Patient ID: Kiara Kim, female   DOB: Mar 30, 1971, 41 y.o.   MRN: [id number] Pt calling requesting 90 day supply of insulin pump 100 unit, pt PCP ordinally wrote rx for pt, I told pt that JF would like want PCP to take care of this matter. Pt okay with this and will call pcp office.

## 2011-12-21 ENCOUNTER — Encounter: Payer: Self-pay | Admitting: *Deleted

## 2011-12-21 NOTE — Progress Notes (Unsigned)
Patient ID: Kiara Kim, female   DOB: [birth year], 41 y.o.   MRN: [id number] [name] form [facility] called to clarify Rx diflucan & cleocin 2%. Told him rx should be Cleocin vaginal cream to apply each bedtime for 5 days and Diflucan 150 mg to take 1 by mouth every other day for 3 days.

## 2011-12-31 ENCOUNTER — Ambulatory Visit: Payer: 59 | Admitting: Gynecology

## 2012-02-26 ENCOUNTER — Telehealth: Payer: Self-pay | Admitting: *Deleted

## 2012-02-26 NOTE — Telephone Encounter (Signed)
Pharmacy sent a RX request for Clindamycin and I denied and advised pt to make apt. KW

## 2012-05-19 ENCOUNTER — Ambulatory Visit (INDEPENDENT_AMBULATORY_CARE_PROVIDER_SITE_OTHER): Payer: 59 | Admitting: Gynecology

## 2012-05-19 ENCOUNTER — Encounter: Payer: Self-pay | Admitting: Gynecology

## 2012-05-19 VITALS — BP 136/90

## 2012-05-19 DIAGNOSIS — B373 Candidiasis of vulva and vagina: Secondary | ICD-10-CM

## 2012-05-19 DIAGNOSIS — B3731 Acute candidiasis of vulva and vagina: Secondary | ICD-10-CM

## 2012-05-19 DIAGNOSIS — L293 Anogenital pruritus, unspecified: Secondary | ICD-10-CM

## 2012-05-19 DIAGNOSIS — N898 Other specified noninflammatory disorders of vagina: Secondary | ICD-10-CM

## 2012-05-19 DIAGNOSIS — Z23 Encounter for immunization: Secondary | ICD-10-CM

## 2012-05-19 LAB — WET PREP FOR TRICH, YEAST, CLUE: Yeast Wet Prep HPF POC: NONE SEEN

## 2012-05-19 MED ORDER — TERCONAZOLE 0.4 % VA CREA: TOPICAL_CREAM | VAGINAL | Status: DC

## 2012-05-19 NOTE — Patient Instructions (Signed)
Candidal Vulvovaginitis Candidal vulvovaginitis is an infection of the vagina and vulva. The vulva is the skin around the opening of the vagina. This may cause itching and discomfort in and around the vagina.  HOME CARE  Only take medicine as told by your doctor.  Do not have sex (intercourse) until the infection is healed or as told by your doctor.  Practice safe sex.  Tell your sex partner about your infection.  Do not douche or use tampons.  Wear cotton underwear. Do not wear tight pants or panty hose.  Eat yogurt. This may help treat and prevent yeast infections. GET HELP RIGHT AWAY IF:   You have a fever.  Your problems get worse during treatment or do not get better in 3 days.  You have discomfort, irritation, or itching in your vagina or vulva area.  You have pain after sex.  You start to get belly (abdominal) pain. MAKE SURE YOU:  Understand these instructions.  Will watch your condition.  Will get help right away if you are not doing well or get worse. Document Released: 08/17/2008 Document Revised: 08/13/2011 Document Reviewed: 08/17/2008 ExitCare Patient Information 2013 [employer].  

## 2012-05-19 NOTE — Progress Notes (Signed)
Patient is a 41 year old who presents to the office with a complaint of on and off chronic vulvar irritation and pruritus. Patient is a diabetic on insulin pump and been followed by the endocrinologist Dr. Lurene Shadow. Several months ago she tried over-the-counter Monistat. She is in a monogamous relationship. Patient is having normal menstrual cycles. Patient's husband with history of female factor infertility with varicocele and absent right testes and low sperm count.  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 Patient stated her last hemoglobin A1c was 7.0. We discussed long-term treatment for her  Recurrent symptoms as follows: She'll be prescribed Terazol 7 to apply each bedtime for 2 weeks and didn't take one week off. The following week she'll apply one applicator full each bedtime for one week and then after that for 6 months she'll apply it intravaginally once a week. She knows that if she conceives needs  to stop the medication.

## 2012-05-20 ENCOUNTER — Encounter: Payer: Self-pay | Admitting: *Deleted

## 2012-05-20 ENCOUNTER — Encounter: Payer: Self-pay | Admitting: Gynecology

## 2012-05-20 NOTE — Progress Notes (Signed)
Patient ID: Kiara Kim, female   DOB: [birth year], 41 y.o.   MRN: [id number] Terazol cream Rx that was given 05/19/12 did not go electronically therefore I called in the prescription. KW

## 2012-06-03 ENCOUNTER — Encounter: Payer: Self-pay | Admitting: Gynecology

## 2012-06-19 ENCOUNTER — Encounter: Payer: 59 | Admitting: Gynecology

## 2012-06-27 ENCOUNTER — Encounter: Payer: Self-pay | Admitting: Gynecology

## 2012-06-27 ENCOUNTER — Ambulatory Visit (INDEPENDENT_AMBULATORY_CARE_PROVIDER_SITE_OTHER): Payer: 59 | Admitting: Gynecology

## 2012-06-27 VITALS — BP 148/94 | Ht 68.0 in | Wt 207.0 lb

## 2012-06-27 DIAGNOSIS — Z01419 Encounter for gynecological examination (general) (routine) without abnormal findings: Secondary | ICD-10-CM

## 2012-06-27 DIAGNOSIS — Z23 Encounter for immunization: Secondary | ICD-10-CM

## 2012-06-27 NOTE — Progress Notes (Signed)
Kiara Kim [birth year] [id number]   History:    42 y.o.  for annual exam who has a history of type 2 diabetes on insulin pump. She has been followed by the endocrinologist Dr. Lurene Shadow. Dr. Lurene Shadow date her lab work past few days ago. Patient had a baseline mammogram at 37. Her mother had breast cancer at the age of 30. Patient does her monthly self breast examination. Patient states her cycles are regular every 21-35 days. She is contemplating on getting pregnant this year.Husband with history of female factor infertility and has had a varicocele repair in the past and has an absent right testes, and low sperm count. Patient's only pregnancy was successful the ovulation induction medication and sperm washing and IUI. Husband has been followed by Dr. Patsi Sears urologist. Patient would know prior history of abnormal Pap smear. Her flu shot is up-to-date. Patient interested in the Tdap vaccine. Patient has not seen the ophthalmologist this year.    Past medical history,surgical history, family history and social history were all reviewed and documented in the EPIC chart.  Gynecologic History Patient's last menstrual period was 06/18/2012. Contraception: none Last Pap: 2013. Results were: normal Last mammogram: Age 12. Results were: normal  Obstetric History OB History    Grav Para Term Preterm Abortions TAB SAB Ect Mult Living   3 1 1  2  2   1      # Outc Date GA Lbr Len/2nd Wgt Sex Del Anes PTL Lv   1 TRM     F CS  No Yes   2 SAB            3 SAB                ROS: A ROS was performed and pertinent positives and negatives are included in the history.  GENERAL: No fevers or chills. HEENT: No change in vision, no earache, sore throat or sinus congestion. NECK: No pain or stiffness. CARDIOVASCULAR: No chest pain or pressure. No palpitations. PULMONARY: No shortness of breath, cough or wheeze. GASTROINTESTINAL: No abdominal pain, nausea, vomiting or diarrhea, melena or bright red blood per  rectum. GENITOURINARY: No urinary frequency, urgency, hesitancy or dysuria. MUSCULOSKELETAL: No joint or muscle pain, no back pain, no recent trauma. DERMATOLOGIC: No rash, no itching, no lesions. ENDOCRINE: No polyuria, polydipsia, no heat or cold intolerance. No recent change in weight. HEMATOLOGICAL: No anemia or easy bruising or bleeding. NEUROLOGIC: No headache, seizures, numbness, tingling or weakness. PSYCHIATRIC: No depression, no loss of interest in normal activity or change in sleep pattern.     Exam: chaperone present  BP 148/94  Ht 5\' 8"  (1.727 m)  Wt 207 lb (93.895 kg)  BMI 31.47 kg/m2  LMP 06/18/2012  Body mass index is 31.47 kg/(m^2).  General appearance : Well developed well nourished female. No acute distress HEENT: Neck supple, trachea midline, no carotid bruits, no thyroidmegaly Lungs: Clear to auscultation, no rhonchi or wheezes, or rib retractions  Heart: Regular rate and rhythm, no murmurs or gallops Breast:Examined in sitting and supine position were symmetrical in appearance, no palpable masses or tenderness,  no skin retraction, no nipple inversion, no nipple discharge, no skin discoloration, no axillary or supraclavicular lymphadenopathy Abdomen: no palpable masses or tenderness, no rebound or guarding Extremities: no edema or skin discoloration or tenderness  Pelvic:  Bartholin, Urethra, Skene Glands: Within normal limits             Vagina: No gross lesions or discharge  Cervix: No gross lesions or discharge  Uterus  anteverted, normal size, shape and consistency, non-tender and mobile  Adnexa  Without masses or tenderness  Anus and perineum  normal   Rectovaginal  normal sphincter tone without palpated masses or tenderness             Hemoccult not done     Assessment/Plan:  42 y.o. female for annual exam with type 2 diabetes on insulin pump. Normal gynecological exam today. Patient would like to try to conceive this year. Her husband we'll followup with  urologist for sperm count. She will then make an appointment to see me so that we can coordinate perhaps placing her on ovulation induction medication along with timing of intercourse or possibly with sperm washing and intrauterine insemination. No labs done today PCP drew them a few days ago. Patient's blood pressure medication was recently adjusted yesterday. Patient received the Tdap vaccine today. Patient with no prior history of abnormal Pap smear. New guidelines discussed. No Pap smear done today. Patient encouraged to do her monthly self breast examination. Patient to stay on prenatal vitamins.    Ok Edwards MD, 11:35 AM 06/27/2012

## 2012-06-27 NOTE — Patient Instructions (Signed)

## 2012-07-23 ENCOUNTER — Encounter: Payer: Self-pay | Admitting: *Deleted

## 2012-07-23 ENCOUNTER — Encounter: Payer: 59 | Attending: Endocrinology | Admitting: *Deleted

## 2012-07-23 DIAGNOSIS — Z713 Dietary counseling and surveillance: Secondary | ICD-10-CM | POA: Insufficient documentation

## 2012-07-23 DIAGNOSIS — E119 Type 2 diabetes mellitus without complications: Secondary | ICD-10-CM | POA: Insufficient documentation

## 2012-07-23 NOTE — Progress Notes (Signed)
  Medical Nutrition Therapy:  Appt start time: 1500 end time:  1600.  Assessment:  Primary concerns today: patient here for review of Carb Counting due to DM1 and on insulin pump. 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 Lives with husband and 40 year old daughter. Likes to go to mall and the movies, more exercise on the weekends. SMBG 3-4 times a day and has been on Medtronic insulin pump for about 14 years.   MEDICATIONS: see list. Uses Humalog in her pump   DIETARY INTAKE:  Usual eating pattern includes 2-3 meals and 1-3 snacks per day.  Everyday foods include variety of all food groups, some high in fat and or sugar.  Avoided foods include none stated.    24-hr recall:  B ( AM): skips about 2-3 times a week, sweetened cereal with 2% milk with fresh fruit OR protein shake with fresh fruit OR toast with milk Snk ( AM): occasionally crackers and water  L ( PM): varies a lot; sandwich with chips OR personal pan pizza OR hot pockets OR chicken salad with crackers, water with regular Gingerale Snk ( PM): not usually D ( PM): both prepare supper; meat, starch, vegetable or salad with Ranch dressing, fruit juice or water Snk ( PM): occasionally chips Beverages: water, milk, fruit juice, regular soda or sweet tea  Usual physical activity: Zumba classes, and walks daughter to bus stop every day, walking on weekends with errands  Estimated energy needs: 1400 calories 158 g carbohydrates 105 g protein 39 g fat  Progress Towards Goal(s):  In progress.   Nutritional Diagnosis:  NB-1.1 Food and nutrition-related knowledge deficit As related to diabetes management .  As evidenced by A1c of 10.5% on 06/26/2012.    Intervention:  Nutrition counseling and diabetes education intiated. Discussed basic physiology of diabetes, SMBG and rationale of checking BG at alternate times of day, A1c, Carb Counting and reading food labels, and benefits of increased activity.  Discussed value of uploading her pump to [facility] from home, we were not able to upload it in office today due to problem with my [facility] USB. Also discussed rationale of using APPS for more nutrition information  Plan:  Aim for 3 Carb Choices per meal (45 grams) +/- 1 either way  Aim for 0-2 Carbs per snack if hungry  Consider reading food labels for Total Carbohydrate of foods Consider checking BG at alternate times per day as directed by MD  Continue using Bolus Wizard on Medtronic insulin pump to deliver food and correction insulin more accurately as directed by MD  Handouts given during visit include: Carb Counting and Food Label handouts Meal Plan Card  Monitoring/Evaluation:  Dietary intake, exercise, [facility] Upload at next visit, and body weight in 4 week(s).

## 2012-08-03 ENCOUNTER — Other Ambulatory Visit: Payer: Self-pay | Admitting: Gynecology

## 2012-08-03 NOTE — Patient Instructions (Signed)
Plan:  Aim for 3 Carb Choices per meal (45 grams) +/- 1 either way  Aim for 0-2 Carbs per snack if hungry  Consider reading food labels for Total Carbohydrate of foods Consider checking BG at alternate times per day as directed by MD  Continue using Bolus Wizard on Medtronic insulin pump to deliver food and correction insulin more accurately as directed by MD

## 2012-08-20 ENCOUNTER — Encounter: Payer: Self-pay | Admitting: *Deleted

## 2012-08-20 ENCOUNTER — Encounter: Payer: 59 | Attending: Endocrinology | Admitting: *Deleted

## 2012-08-20 DIAGNOSIS — E119 Type 2 diabetes mellitus without complications: Secondary | ICD-10-CM | POA: Insufficient documentation

## 2012-08-20 DIAGNOSIS — Z713 Dietary counseling and surveillance: Secondary | ICD-10-CM | POA: Insufficient documentation

## 2012-08-20 NOTE — Progress Notes (Signed)
Medical Nutrition Therapy:  Appt start time: 1630 end time:  1700.  Assessment:  Primary concerns today: patient here for follow up visit for review of Carb Counting due to DM1 and on insulin pump.  Feels successful with her Carb counting averaging 3 Choices per meal and that it has improved her knowledge of food choices. Also states she is choosing healthier snacks and using phone APP Calorie Brooke Dare often. Has decided not to drink regular sodas anymore and is drinking water instead now. Weight loss of 2 pounds noted. Has continued with Zumba classes when not fasting. She fasted for 7 days with her church at which time she suspended her pump for several hours each day due to no food intake except for some fruit juices and water. No symptoms of ketones with omission of all insulin for this time frame. There were 3 episodes of hypoglycemia with no insulin being delivered by the pump. Also states she has history of hypoglycemia about 3-4 times a week when not fasting and lately they have become more severe.  MEDICATIONS: see list. Uses Humalog in her pump   DIETARY INTAKE:  Usual eating pattern includes 2-3 meals and 1-3 snacks per day.  Everyday foods include variety of all food groups, .  Avoided foods include foods high in fat and or sugar now  24-hr recall:  B ( AM): skips about 2-3 times a week, has continued to skip since the 7 day fast. Snk ( AM): occasionally crackers and water  L ( PM): varies a lot; sandwich with chips OR personal pan pizza OR hot pockets OR chicken salad with crackers, water. No more Gingerale Snk ( PM): not usually D ( PM): both prepare supper; meat, starch, vegetable or salad with Ranch dressing, fruit juice or water Snk ( PM): occasionally chips Beverages: water, milk, with occasional fruit juice, no more regular soda or sweet tea  Usual physical activity: Zumba classes, and walks daughter to bus stop every day, walking on weekends with errands  Estimated energy  needs: 1400 calories 158 g carbohydrates 105 g protein 39 g fat  Progress Towards Goal(s):  In progress.   Nutritional Diagnosis:  NB-1.1 Food and nutrition-related knowledge deficit As related to diabetes management .  As evidenced by A1c of 10.5% on 06/26/2012.    Intervention:  Commended her on her improved carb counting and taking responsibility to make better choices now that she is more aware of the carb content of her foods. Also commended her on omitting regular sodas and sweet tea. We reviewed the [facility] Pro Reports and since she fasted for 7 days and discontinued her basal rates for much of that time we could not use the data to make assessment of current pump settings. But based on the fact that her insulin needs plummeted when she was fasting, I look forward to evaluating the reports when she provides more BG data at the next appointment. Now that she has seen the potential of the reports she agrees that more frequent BG testing is warranted.  Plan:  Continue aiming for 3 Carb Choices per meal (45 grams) +/- 1 either way  Continue aiming for 0-2 Carbs per snack if hungry  Continue reading food labels for Total Carbohydrate of foods Consider checking BG before each meal more consistently  Continue using Calorie Brooke Dare APP for more nutrition information Continue using Bolus Wizard on Medtronic insulin pump to deliver food and correction insulin more accurately   Handouts given during visit include: Copy  of [facility] Pro Reports provided to patient  Monitoring/Evaluation:  Dietary intake, exercise, [facility] Upload at next visit, and body weight in 6 week(s).

## 2012-08-20 NOTE — Patient Instructions (Signed)
Plan:  Continue aiming for 3 Carb Choices per meal (45 grams) +/- 1 either way  Continue aiming for 0-2 Carbs per snack if hungry  Continue reading food labels for Total Carbohydrate of foods Consider checking BG before each meal more consistently  Continue using Calorie Brooke Dare APP for more nutrition information Continue using Bolus Wizard on Medtronic insulin pump to deliver food and correction insulin more accurately

## 2012-10-01 ENCOUNTER — Ambulatory Visit: Payer: 59 | Admitting: *Deleted

## 2012-10-07 ENCOUNTER — Other Ambulatory Visit: Payer: Self-pay

## 2012-10-07 DIAGNOSIS — Z1231 Encounter for screening mammogram for malignant neoplasm of breast: Secondary | ICD-10-CM

## 2012-10-23 ENCOUNTER — Ambulatory Visit: Payer: 59 | Admitting: *Deleted

## 2012-11-13 ENCOUNTER — Ambulatory Visit: Payer: 59

## 2012-11-14 ENCOUNTER — Ambulatory Visit
Admission: RE | Admit: 2012-11-14 | Discharge: 2012-11-14 | Disposition: A | Discharge status: Home / Self Care | Payer: 59 | Source: Ambulatory Visit

## 2012-11-14 DIAGNOSIS — Z1231 Encounter for screening mammogram for malignant neoplasm of breast: Secondary | ICD-10-CM

## 2012-11-27 ENCOUNTER — Ambulatory Visit: Payer: 59 | Admitting: *Deleted

## 2012-12-25 ENCOUNTER — Ambulatory Visit: Payer: 59 | Admitting: *Deleted

## 2013-03-25 ENCOUNTER — Telehealth: Payer: Self-pay

## 2013-03-25 NOTE — Telephone Encounter (Signed)
Patient called wanting to know what she needed to do to arrange an insemination.  She is not currently undergoing any infertilty treatments and per Dr. Manuela Schwartz 06/2012 office note I recommended that she schedule consultation appt with Dr. Glenetta Hew to arrange a plan.

## 2013-03-31 ENCOUNTER — Encounter: Payer: Self-pay | Admitting: Gynecology

## 2013-03-31 ENCOUNTER — Ambulatory Visit (INDEPENDENT_AMBULATORY_CARE_PROVIDER_SITE_OTHER): Payer: 59 | Admitting: Gynecology

## 2013-03-31 VITALS — BP 128/86

## 2013-03-31 DIAGNOSIS — E119 Type 2 diabetes mellitus without complications: Secondary | ICD-10-CM

## 2013-03-31 DIAGNOSIS — N915 Oligomenorrhea, unspecified: Secondary | ICD-10-CM

## 2013-03-31 NOTE — Progress Notes (Signed)
Patient presented to the office today to discuss her plans for pregnancy. She is a 42 year old history of type 2 diabetes on insulin pump. She has been followed by the endocrinologist Dr. Lurene Shadow. She stated that she has had normal menstrual cycles until July. She skipped the month of August and September and in October she had a normal menstrual cycle.  The patient's only successful pregnancy was the ovulation induction and sperm washing and IUI. Patient's husband has been followed by Dr. Patsi Sears urologist. He has female factor infertility and has had a varicocele repair in the past and has an absent right testes, and low sperm count.   Patient had informed me that before the birth of her last child over 7 years ago she had been followed by [facility] whereby she had gone through 3 IVF cycles of which 2 were with frozen eggs  and the last one where she did conceive was a mixture of husband's sperm and donor sperm via ICSI  and she successfully conceived but miscarried at 9 weeks.  It was after that here in [place] she conceived via ovulation induction medication, sperm washing and IUI. She carried her pregnancy to completion and child is now 61 years old.  We had a lengthy discussion today and explained to her that she is 42 years of age which decreases her fertility capabilities and if she were to conceive she is a high-risk of aneuploidy, worsening hypertension and diabetes as was the risk for miscarriage and preterm delivery. I'm going to refer her back to Dr. 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 I have asked her to inform her husband to abstain from intercourse for 72 hours before the revisit to the reproductive endocrinologist in the event he may want to repeat his semen analysis to compare with prior study. I will for him a copy of the softness notes. I have encouraged her to continue on prenatal vitamins and to engage in regular  exercise. We will check her prolactin level today. Her endocrinologist has recently done all her lab work and is monitoring closely her blood sugars in treating her for hypertension as well.

## 2013-03-31 NOTE — Patient Instructions (Signed)
In Vitro Fertilization (IVF) In vitro fertilization (IVF) is also called Assisted Reproductive Technologies. Through laparoscopy (minor surgery), eggs are removed from the ovaries. In the lab, the female's sperm and the woman's egg are combined. The resulting embryos are inserted into the uterus through the cervix in hopes of a pregnancy. Other forms of IVF are gamete intrafallopian transfer (GIFT) and zygote intrafallopian transfer (ZIFT). Candidates for IVF include:  Infertile couples.  Women who underwent premature menopause/ovarian failure.  Women who had both ovaries removed.  Women who have blocked fallopian tubes. The ideal ages for IVF is 40 years old or younger. There is no age limit as a candidate for IVF, but it is recommended not to do IVF during or after menopause. IVF is expensive, a time commitment and a consuming process that should be discussed before doing IVF. LET YOUR CAREGIVER KNOW ABOUT:   Any medical or genetic problems with you or your family.  The medications you are taking. These include over-the-counter, prescription, herbal, eye drops and creams.  Any excessive alcohol drinking, smoking or taking illegal drugs.  Past problems with anesthesia.  Past surgery and pregnancies.  Any allergies to medications. RISKS AND COMPLICATIONS   Bleeding and infection.  Anesthesia problems.  The procedure not working.  Having a multiple pregnancy (2 or more).  There is a higher incidence of early delivery, even with a single pregnancy.  A higher incidence of pregnancy in the tube with ZIFT and GIFT that will require medical treatment or surgery. BEFORE THE PROCEDURE  Many tests, treatments and exams are done on the woman and sperm donor. These include:  A complete history, physical exam and a Pap test.  A complete history and physical exam of the sperm donor.  The sperm is analyzed (semenalysis) to see if they are normal, there are enough present to fertilize the  egg and they act normally after having sexual intercourse (post coital examination of the sperm).  Check for sexually transmitted disease or hepatitis in either person.  Hormone tests and ovulation testing.  Ultrasound of the pelvic organs.  X-ray of the uterus to make sure the uterus and fallopian tubes are normal after dye inserted into the uterus (hysterosalpingogram).  History and testing both the woman and sperm donor for genetic problems in them or their family.  A laparoscopy (for some women) to be sure there are no pelvic diseases that may be a problem during IVF. Women with certain disease or illness may not be a candidate for IVF.  Special hormone treatment will be given to you to stimulate the ovaries to produce many eggs. This is called hyperstimulation of the ovaries. AFTER THE PROCEDURE   You may have to continue lying down for an hour or more before going home.  If you had ZIFT or GIFT that requires a laparoscopy, you will be asked to remain at the surgical center or hospital until your vital signs are stable and can safely be discharged. Someone needs to drive you home after having a laparoscopy.  You will continue to take hormone therapy for about three months or as told by your caregiver.  Resume your normal diet and activities.  If you become pregnant, begin routine prenatal care unless you have a high risk pregnancy that may require more testing and prenatal visits. HOME CARE INSTRUCTIONS   Follow the advice and recommendations of your caregiver.  Only take over-the-counter or prescription medicines for pain, discomfort or fever as directed by your caregiver.  If   you become pregnant, keep all of your prenatal appointments and testing as directed by your caregiver.  If you had ZIFT or GIFT with a laparoscopy, rest and decrease your activities for a couple of days. SEEK MEDICAL CARE IF:   You develop abnormal vaginal discharge.  You develop a rash.  You are  having problems with your medications.  You become lightheaded or feel faint.  You had a laparoscopy and need a prescription for pain medication. SEEK IMMEDIATE MEDICAL CARE IF:   You develop vaginal bleeding.  You develop abdominal pain.  You develop a temperature of 102 F (38.9 C) or higher.  You pass out.  You have pus or bleeding from the laparoscopy incisions. MAKE SURE YOU:   Understand these instructions.  Will watch your condition.  Will get help right away if you are not doing well or get worse. Document Released: 05/03/2008 Document Revised: 08/13/2011 Document Reviewed: 05/03/2008 Exit[employer] Patient Information 2014 [employer].  

## 2013-04-01 ENCOUNTER — Telehealth: Payer: Self-pay | Admitting: *Deleted

## 2013-04-01 LAB — PROLACTIN: Prolactin: 2.4 ng/mL

## 2013-04-01 NOTE — Telephone Encounter (Signed)
Referral faxed to Dr.Yalcinkaya office, they will contact pt with time and date and inform me as well. 

## 2013-04-02 NOTE — Telephone Encounter (Signed)
Appt. 04/27/13 @ 2:00 pm

## 2013-04-28 ENCOUNTER — Other Ambulatory Visit: Payer: Self-pay | Admitting: Gynecology

## 2013-06-23 ENCOUNTER — Encounter (HOSPITAL_BASED_OUTPATIENT_CLINIC_OR_DEPARTMENT_OTHER): Payer: Self-pay | Admitting: *Deleted

## 2013-06-26 ENCOUNTER — Encounter (HOSPITAL_BASED_OUTPATIENT_CLINIC_OR_DEPARTMENT_OTHER): Payer: Self-pay | Admitting: *Deleted

## 2013-06-26 NOTE — Progress Notes (Signed)
NPO AFTER MN WITH EXCEPTION CLEAR LIQUIDS UNTIL 0700 (NO CREAM/ MILK PRODUCTS). ARRIVE AT 1100. PRE-OP ORDERS PENDING, OFFICE HAS BEEN NOTIFIED TWICE.  NEEDS ISTAT, EKG, AND URINE PREG. WILL TAKE LABETOLOL, NORVASC, AND ALDOMET AM DOS W/ SIPS OF WATER. PT HAS INSULIN PUMP AND AWARE TO LEAVE IT ON.

## 2013-06-30 ENCOUNTER — Other Ambulatory Visit (HOSPITAL_COMMUNITY)
Admission: RE | Admit: 2013-06-30 | Discharge: 2013-06-30 | Disposition: A | Discharge status: Home / Self Care | Payer: 59 | Source: Ambulatory Visit | Attending: Gynecology | Admitting: Gynecology

## 2013-06-30 ENCOUNTER — Encounter: Payer: Self-pay | Admitting: Gynecology

## 2013-06-30 ENCOUNTER — Ambulatory Visit (INDEPENDENT_AMBULATORY_CARE_PROVIDER_SITE_OTHER): Payer: 59 | Admitting: Gynecology

## 2013-06-30 VITALS — BP 118/74 | Ht 68.0 in | Wt 200.0 lb

## 2013-06-30 DIAGNOSIS — Z1151 Encounter for screening for human papillomavirus (HPV): Secondary | ICD-10-CM | POA: Insufficient documentation

## 2013-06-30 DIAGNOSIS — Z01419 Encounter for gynecological examination (general) (routine) without abnormal findings: Secondary | ICD-10-CM

## 2013-06-30 NOTE — Progress Notes (Signed)
Kiara Kim [birth year] [id number]   History:    43 y.o.  for annual gyn exam with no complaints today. Patient is scheduled to undergo resectoscopic polypectomy by Dr. 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 She has been followed by the endocrinologist Dr. Lurene Shadow. The patient's only successful pregnancy was the ovulation induction and sperm washing and IUI. Patient's husband has been followed by Dr. Patsi Sears urologist. He has female factor infertility and has had a varicocele repair in the past and has an absent right testes, and low sperm count. Her last child was delivered 8 years ago after having undergone 3 IVF cycles of which 20 with frozen aches she conceived with the last one consisting of a mixture of husband's sperm and donor sperm via ICSI but unfortunately miscarried at 9 weeks. Patient with no prior history of abnormal Pap smear.    Past medical history,surgical history, family history and social history were all reviewed and documented in the EPIC chart.  Gynecologic History Patient's last menstrual period was 06/09/2013. Contraception: none Last Pap:  2013 . Results were: normal Last mammogram:  2014 . Results were: normal  Obstetric History OB History  Gravida Para Term Preterm AB SAB TAB Ectopic Multiple Living  3 1 1  2 2    1     # Outcome Date GA Lbr Len/2nd Weight Sex Delivery Anes PTL Lv  3 SAB           2 SAB           1 TRM     F CS  N Y       ROS: A ROS was performed and pertinent positives and negatives are included in the history.  GENERAL: No fevers or chills. HEENT: No change in vision, no earache, sore throat or sinus congestion. NECK: No pain or stiffness. CARDIOVASCULAR: No chest pain or pressure. No palpitations. PULMONARY: No shortness of breath, cough or wheeze. GASTROINTESTINAL: No abdominal pain, nausea, vomiting or  diarrhea, melena or bright red blood per rectum. GENITOURINARY: No urinary frequency, urgency, hesitancy or dysuria. MUSCULOSKELETAL: No joint or muscle pain, no back pain, no recent trauma. DERMATOLOGIC: No rash, no itching, no lesions. ENDOCRINE: No polyuria, polydipsia, no heat or cold intolerance. No recent change in weight. HEMATOLOGICAL: No anemia or easy bruising or bleeding. NEUROLOGIC: No headache, seizures, numbness, tingling or weakness. PSYCHIATRIC: No depression, no loss of interest in normal activity or change in sleep pattern.     Exam: chaperone present  BP 118/74  Ht 5\' 8"  (1.727 m)  Wt 200 lb (90.719 kg)  BMI 30.42 kg/m2  LMP 06/09/2013  Body mass index is 30.42 kg/(m^2).  General appearance : Well developed well nourished female. No acute distress HEENT: Neck supple, trachea midline, no carotid bruits, no thyroidmegaly Lungs: Clear to auscultation, no rhonchi or wheezes, or rib retractions  Heart: Regular rate and rhythm, no murmurs or gallops Breast:Examined in sitting and supine position were symmetrical in appearance, no palpable masses or tenderness,  no skin retraction, no nipple inversion, no nipple discharge, no skin discoloration, no axillary or supraclavicular lymphadenopathy Abdomen: no palpable masses or tenderness, no rebound or guarding Extremities: no edema or skin discoloration or tenderness  Pelvic:  Bartholin, Urethra, Skene Glands: Within normal limits             Vagina: No gross lesions or discharge  Cervix: No  gross lesions or discharge  Uterus   anteverted , normal size, shape and consistency, non-tender and mobile  Adnexa  Without masses or tenderness  Anus and perineum  normal   Rectovaginal  normal sphincter tone without palpated masses or tenderness             Hemoccult  not indicated      Assessment/Plan:  43 y.o. 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 Patient  scheduled for resectoscopic polypectomy tomorrow by reproductive endocrinologist before the next IVF cycle. Patient will continue to follow with Dr. Lurene ShadowBallen for her diabetes. She is being followed by the ophthalmologist for visual exam on a yearly basis. She was reminded to continue to do her monthly breast exam. Her flu vaccine is up-to-date.   Note: This dictation was prepared with  Dragon/digital dictation along withSmart phrase technology. Any transcriptional errors that result from this process are unintentional.   Ok EdwardsFERNANDEZ,JUAN H MD, 9:14 AM 06/30/2013

## 2013-06-30 NOTE — H&P (Signed)
Kiara Kim is a 43 y.o. female , G3 P 1021 originally referred to me by Dr. Lily Peer for infertility. She was recently found to have an endometrial polyp 9 x 9 mm. She has history of type 2 diabetes on insulin pump. She has been followed by the endocrinologist Dr. Lurene Shadow. The patient's only successful pregnancy was the ovulation induction and sperm washing and IUI. Patient's husband has been followed by Dr. Patsi Sears urologist. He has female factor infertility and has had a varicocele repair in the past and has an absent right testis, and low sperm count. Her last child was delivered 8 years ago after natural conception following 3 failed IVF cycles. One FET resulted in miscarriage at 9 weeks. Patient with no prior history of abnormal Pap smear.    Pertinent Gynecological History: Menses: irregular Bleeding: dysfunctional uterine bleeding Contraception: none DES exposure: denies Blood transfusions: none Sexually transmitted diseases: no past history Previous GYN Procedures: 2002 myomectomy  Last pap: normal  OB History: 2 spontaneous abortions, one C-section   Menstrual History: Menarche age: 26 No LMP recorded.    Past Medical History  Diagnosis Date  . Infertility   . Polycystic ovarian disease   . Hypertension   . Insulin pump in place   . Uterine polyp   . Diabetes mellitus type 2, insulin dependent     FOLLOWED BY DR Lurene Shadow  . Wears contact lenses                     Past Surgical History  Procedure Laterality Date  . Hysteroscopy  2005  . Cesarean section  05-23-2005  . Myomectomy abdominal approach  2005             Family History  Problem Relation Age of Onset  . Diabetes Mother   . Hypertension Mother   . Cancer Mother     age 32  . Hypertension Maternal Grandmother   . Diabetes Paternal Grandmother   . Diabetes Paternal Grandfather   . Diabetes Father    No hereditary disease.  No cancer of breast, ovary, uterus. No cutaneous leiomyomatosis  or renal cell carcinoma.  History   Social History  . Marital Status: Married    Spouse Name: N/A    Number of Children: N/A  . Years of Education: N/A   Occupational History  . Not on file.   Social History Main Topics  . Smoking status: Never Smoker   . Smokeless tobacco: Never Used  . Alcohol Use: No  . Drug Use: No  . Sexual Activity: Yes    Partners: Male    Birth Control/ Protection: None   Other Topics Concern  . Not on file   Social History Narrative  . No narrative on file    No Known Allergies  No current facility-administered medications on file prior to encounter.   Current Outpatient Prescriptions on File Prior to Encounter  Medication Sig Dispense Refill  . Prenat-FeFum-FePo-FA-Omega 3 (CONCEPT DHA) 53.5-38-1 MG CAPS TAKE 1 CAPSULE DAILY  90 capsule  1  . Insulin Human (INSULIN PUMP) 100 unit/ml SOLN Inject into the skin every 4 (four) hours. HUMOLOG--  TOTAL 68.9 UNITS PER DAY                        Q4 HR INCREMENTS FROM 12 AM TO 3PM ---  3.1  4PM -- 2.4                           7PM -- 2.9         Review of Systems  Constitutional: Negative.   HENT: Negative.   Eyes: Negative.   Respiratory: Negative.   Cardiovascular: Negative.   Gastrointestinal: Negative.   Genitourinary: Negative.   Musculoskeletal: Negative.   Skin: Negative.   Neurological: Negative.   Endo/Heme/Allergies: type 2 diabetes   Psychiatric/Behavioral: Negative.     Physical Exam  Ht 5\' 9"  (1.753 m)  Wt 89.359 kg (197 lb)  BMI 29.08 kg/m2  LMP 06/09/2013 Constitutional: She is oriented to person, place, and time. She appears well-developed and well-nourished.  HENT:  Head: Normocephalic and atraumatic.  Nose: Nose normal.  Mouth/Throat: Oropharynx is clear and moist. No oropharyngeal exudate.  Eyes: Conjunctivae normal and EOM are normal. Pupils are equal, round, and reactive to light. No scleral icterus.  Neck: Normal range of motion. Neck  supple. No tracheal deviation present. No thyromegaly present.  Cardiovascular: Normal rate.   Respiratory: Effort normal and breath sounds normal.  GI: Soft. Bowel sounds are normal. She exhibits no distension and no mass. There is no tenderness.  Lymphadenopathy:    She has no cervical adenopathy.  Neurological: She is alert and oriented to person, place, and time. She has normal reflexes.  Skin: Skin is warm.  Psychiatric: She has a normal mood and affect. Her behavior is normal. Judgment and thought content normal.       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 Benefits and risks of the procedure were reviewed with the patient and all her questions were answered.  Fermin SchwabYALCINKAYA,Ezmeralda Stefanick

## 2013-07-01 ENCOUNTER — Observation Stay (HOSPITAL_BASED_OUTPATIENT_CLINIC_OR_DEPARTMENT_OTHER)
Admission: RE | Admit: 2013-07-01 | Discharge: 2013-07-02 | Disposition: A | Discharge status: Home / Self Care | Payer: 59 | Source: Ambulatory Visit | Attending: Obstetrics and Gynecology | Admitting: Obstetrics and Gynecology

## 2013-07-01 ENCOUNTER — Other Ambulatory Visit: Payer: Self-pay

## 2013-07-01 ENCOUNTER — Ambulatory Visit (HOSPITAL_COMMUNITY): Payer: 59

## 2013-07-01 ENCOUNTER — Encounter (HOSPITAL_BASED_OUTPATIENT_CLINIC_OR_DEPARTMENT_OTHER): Payer: Self-pay | Admitting: *Deleted

## 2013-07-01 ENCOUNTER — Ambulatory Visit (HOSPITAL_BASED_OUTPATIENT_CLINIC_OR_DEPARTMENT_OTHER): Payer: 59 | Admitting: Anesthesiology

## 2013-07-01 ENCOUNTER — Encounter (HOSPITAL_BASED_OUTPATIENT_CLINIC_OR_DEPARTMENT_OTHER): Payer: 59 | Admitting: Anesthesiology

## 2013-07-01 ENCOUNTER — Encounter (HOSPITAL_COMMUNITY)
Admission: RE | Disposition: A | Discharge status: Home / Self Care | Payer: Self-pay | Source: Ambulatory Visit | Attending: Obstetrics and Gynecology

## 2013-07-01 DIAGNOSIS — N840 Polyp of corpus uteri: Principal | ICD-10-CM | POA: Diagnosis present

## 2013-07-01 DIAGNOSIS — I10 Essential (primary) hypertension: Secondary | ICD-10-CM | POA: Insufficient documentation

## 2013-07-01 DIAGNOSIS — Z794 Long term (current) use of insulin: Secondary | ICD-10-CM | POA: Insufficient documentation

## 2013-07-01 DIAGNOSIS — E119 Type 2 diabetes mellitus without complications: Secondary | ICD-10-CM | POA: Insufficient documentation

## 2013-07-01 DIAGNOSIS — R52 Pain, unspecified: Secondary | ICD-10-CM

## 2013-07-01 DIAGNOSIS — E282 Polycystic ovarian syndrome: Secondary | ICD-10-CM | POA: Insufficient documentation

## 2013-07-01 HISTORY — PX: HYSTEROSCOPY WITH D & C: SHX1775

## 2013-07-01 HISTORY — DX: Presence of spectacles and contact lenses: Z97.3

## 2013-07-01 HISTORY — DX: Long term (current) use of insulin: Z79.4

## 2013-07-01 HISTORY — DX: Type 2 diabetes mellitus without complications: E11.9

## 2013-07-01 HISTORY — DX: Presence of insulin pump (external) (internal): Z96.41

## 2013-07-01 HISTORY — DX: Polyp of corpus uteri: N84.0

## 2013-07-01 LAB — POCT I-STAT 4, (NA,K, GLUC, HGB,HCT)
Glucose, Bld: 97 mg/dL (ref 70–99)
HCT: 41 % (ref 36.0–46.0)
HEMOGLOBIN: 13.9 g/dL (ref 12.0–15.0)
Potassium: 4.6 mEq/L (ref 3.7–5.3)
Sodium: 141 mEq/L (ref 137–147)

## 2013-07-01 LAB — GLUCOSE, CAPILLARY
GLUCOSE-CAPILLARY: 83 mg/dL (ref 70–99)
Glucose-Capillary: 90 mg/dL (ref 70–99)
Glucose-Capillary: 80 mg/dL (ref 70–99)

## 2013-07-01 LAB — POCT PREGNANCY, URINE: Preg Test, Ur: NEGATIVE

## 2013-07-01 LAB — PREGNANCY, URINE: PREG TEST UR: NEGATIVE

## 2013-07-01 SURGERY — DILATATION AND CURETTAGE /HYSTEROSCOPY
Anesthesia: General | Site: Vagina

## 2013-07-01 MED ORDER — ALBUTEROL SULFATE (2.5 MG/3ML) 0.083% IN NEBU
2.5000 mg | INHALATION_SOLUTION | Freq: Four times a day (QID) | RESPIRATORY_TRACT | Status: DC
  Administered 2013-07-01 – 2013-07-02 (×4): 2.5 mg via RESPIRATORY_TRACT
  Filled 2013-07-01 (×5): qty 3

## 2013-07-01 MED ORDER — FENTANYL CITRATE 0.05 MG/ML IJ SOLN
INTRAMUSCULAR | Status: DC | PRN
  Administered 2013-07-01 (×2): 25 ug via INTRAVENOUS
  Administered 2013-07-01: 50 ug via INTRAVENOUS

## 2013-07-01 MED ORDER — SODIUM CHLORIDE 0.9 % IR SOLN
Status: DC | PRN
  Administered 2013-07-01: 3000 mL

## 2013-07-01 MED ORDER — MIDAZOLAM HCL 2 MG/2ML IJ SOLN
INTRAMUSCULAR | Status: AC
  Filled 2013-07-01: qty 2

## 2013-07-01 MED ORDER — PROMETHAZINE HCL 25 MG/ML IJ SOLN
6.2500 mg | INTRAMUSCULAR | Status: DC | PRN
  Filled 2013-07-01: qty 1

## 2013-07-01 MED ORDER — LIDOCAINE HCL (CARDIAC) 20 MG/ML IV SOLN
INTRAVENOUS | Status: DC | PRN
  Administered 2013-07-01: 80 mg via INTRAVENOUS

## 2013-07-01 MED ORDER — SUCCINYLCHOLINE CHLORIDE 20 MG/ML IJ SOLN
INTRAMUSCULAR | Status: DC | PRN
  Administered 2013-07-01: 100 mg via INTRAVENOUS

## 2013-07-01 MED ORDER — LACTATED RINGERS IV SOLN
INTRAVENOUS | Status: DC
  Administered 2013-07-01 (×3): via INTRAVENOUS
  Filled 2013-07-01: qty 1000

## 2013-07-01 MED ORDER — FENTANYL CITRATE 0.05 MG/ML IJ SOLN
INTRAMUSCULAR | Status: AC
  Filled 2013-07-01: qty 6

## 2013-07-01 MED ORDER — VASOPRESSIN 20 UNIT/ML IJ SOLN
INTRAVENOUS | Status: DC | PRN
  Administered 2013-07-01: 13:00:00 via INTRAMUSCULAR

## 2013-07-01 MED ORDER — MIDAZOLAM HCL 5 MG/5ML IJ SOLN
INTRAMUSCULAR | Status: DC | PRN
  Administered 2013-07-01: 2 mg via INTRAVENOUS

## 2013-07-01 MED ORDER — HYDROMORPHONE HCL PF 1 MG/ML IJ SOLN
0.2500 mg | INTRAMUSCULAR | Status: DC | PRN
  Filled 2013-07-01: qty 1

## 2013-07-01 MED ORDER — OXYCODONE HCL 5 MG PO TABS
5.0000 mg | ORAL_TABLET | Freq: Once | ORAL | Status: DC | PRN
  Filled 2013-07-01: qty 1

## 2013-07-01 MED ORDER — ALBUTEROL SULFATE (2.5 MG/3ML) 0.083% IN NEBU
2.5000 mg | INHALATION_SOLUTION | Freq: Four times a day (QID) | RESPIRATORY_TRACT | Status: DC | PRN
  Administered 2013-07-01: 2.5 mg via RESPIRATORY_TRACT
  Filled 2013-07-01: qty 3

## 2013-07-01 MED ORDER — ALBUTEROL SULFATE (2.5 MG/3ML) 0.083% IN NEBU
INHALATION_SOLUTION | RESPIRATORY_TRACT | Status: AC
  Filled 2013-07-01: qty 3

## 2013-07-01 MED ORDER — MEPERIDINE HCL 25 MG/ML IJ SOLN
6.2500 mg | INTRAMUSCULAR | Status: DC | PRN
  Filled 2013-07-01: qty 1

## 2013-07-01 MED ORDER — PROPOFOL 10 MG/ML IV BOLUS
INTRAVENOUS | Status: DC | PRN
  Administered 2013-07-01: 200 mg via INTRAVENOUS
  Administered 2013-07-01: 100 mg via INTRAVENOUS

## 2013-07-01 MED ORDER — CEFAZOLIN SODIUM-DEXTROSE 2-3 GM-% IV SOLR
INTRAVENOUS | Status: DC | PRN
  Administered 2013-07-01: 2 g via INTRAVENOUS

## 2013-07-01 MED ORDER — SODIUM CHLORIDE 0.9 % IV SOLN
INTRAVENOUS | Status: DC
  Administered 2013-07-01: 21:00:00 via INTRAVENOUS

## 2013-07-01 MED ORDER — ACETAMINOPHEN 325 MG PO TABS: 650.0000 mg | ORAL_TABLET | ORAL | Status: DC | PRN

## 2013-07-01 MED ORDER — OXYCODONE-ACETAMINOPHEN 5-325 MG PO TABS
1.0000 | ORAL_TABLET | ORAL | Status: DC | PRN
  Administered 2013-07-01 (×2): 1 via ORAL
  Administered 2013-07-02 (×3): 2 via ORAL
  Filled 2013-07-01 (×4): qty 1
  Filled 2013-07-01 (×2): qty 2

## 2013-07-01 MED ORDER — OXYCODONE HCL 5 MG/5ML PO SOLN
5.0000 mg | Freq: Once | ORAL | Status: DC | PRN
  Filled 2013-07-01: qty 5

## 2013-07-01 SURGICAL SUPPLY — 42 items
CANISTER SUCTION 2500CC (MISCELLANEOUS) ×2 IMPLANT
CANNULA CURETTE W/SYR 6 (CANNULA) IMPLANT
CATH ROBINSON RED A/P 16FR (CATHETERS) ×2 IMPLANT
CORD ACTIVE DISPOSABLE (ELECTRODE)
CORD ELECTRO ACTIVE DISP (ELECTRODE) IMPLANT
COVER TABLE BACK 60X90 (DRAPES) ×2 IMPLANT
DRAPE CAMERA CLOSED 9X96 (DRAPES) ×2 IMPLANT
DRAPE LG THREE QUARTER DISP (DRAPES) ×4 IMPLANT
DRESSING TELFA 8X3 (GAUZE/BANDAGES/DRESSINGS) ×2 IMPLANT
ELECT LOOP GYNE PRO 24FR (CUTTING LOOP)
ELECT REM PT RETURN 9FT ADLT (ELECTROSURGICAL)
ELECT VAPORTRODE GRVD BAR (ELECTRODE) IMPLANT
ELECTRODE LOOP GYNE PRO 24FR (CUTTING LOOP) IMPLANT
ELECTRODE REM PT RTRN 9FT ADLT (ELECTROSURGICAL) IMPLANT
ELECTRODE ROLLER VERSAPOINT (ELECTRODE) IMPLANT
ELECTRODE RT ANGLE VERSAPOINT (CUTTING LOOP) IMPLANT
GLOVE BIO SURGEON STRL SZ8 (GLOVE) ×2 IMPLANT
GLOVE BIOGEL M STER SZ 6 (GLOVE) ×2 IMPLANT
GLOVE BIOGEL PI IND STRL 6.5 (GLOVE) ×1 IMPLANT
GLOVE BIOGEL PI IND STRL 7.5 (GLOVE) ×1 IMPLANT
GLOVE BIOGEL PI IND STRL 8.5 (GLOVE) ×1 IMPLANT
GLOVE BIOGEL PI INDICATOR 6.5 (GLOVE) ×1
GLOVE BIOGEL PI INDICATOR 7.5 (GLOVE) ×1
GLOVE BIOGEL PI INDICATOR 8.5 (GLOVE) ×1
GOWN STRL REIN XL XLG (GOWN DISPOSABLE) IMPLANT
GOWN STRL REUS W/TWL LRG LVL3 (GOWN DISPOSABLE) ×2 IMPLANT
GOWN STRL REUS W/TWL XL LVL3 (GOWN DISPOSABLE) ×2 IMPLANT
LEGGING LITHOTOMY PAIR STRL (DRAPES) ×2 IMPLANT
LOOP ANGLED CUTTING 22FR (CUTTING LOOP) IMPLANT
NEEDLE SPNL 22GX3.5 QUINCKE BK (NEEDLE) ×2 IMPLANT
PACK BASIN DAY SURGERY FS (CUSTOM PROCEDURE TRAY) ×2 IMPLANT
PAD OB MATERNITY 4.3X12.25 (PERSONAL CARE ITEMS) ×2 IMPLANT
SET IRRIG Y TYPE TUR BLADDER L (SET/KITS/TRAYS/PACK) IMPLANT
STENT BALLN UTERINE 3CM 6FR (Stent) IMPLANT
STENT BALLN UTERINE 4CM 6FR (STENTS) IMPLANT
SUT SILK 2 0 SH (SUTURE) IMPLANT
SYR 3ML 18GX1 1/2 (SYRINGE) ×2 IMPLANT
SYR CONTROL 10ML LL (SYRINGE) ×2 IMPLANT
TOWEL OR 17X24 6PK STRL BLUE (TOWEL DISPOSABLE) ×4 IMPLANT
TRAY DSU PREP LF (CUSTOM PROCEDURE TRAY) ×2 IMPLANT
TUBING HYDROFLEX HYSTEROSCOPY (TUBING) ×2 IMPLANT
WATER STERILE IRR 500ML POUR (IV SOLUTION) ×2 IMPLANT

## 2013-07-01 NOTE — Progress Notes (Signed)
Dr. Rica MastFortune called and informed that patient cough productive and secretion colored (pink).  A stat chest ordered.

## 2013-07-01 NOTE — Progress Notes (Signed)
[facility] and patient taken to her hospital room  [id number] via stretcher.

## 2013-07-01 NOTE — Anesthesia Preprocedure Evaluation (Signed)
Anesthesia Evaluation  Patient identified by MRN, date of birth, ID band Patient awake    Reviewed: Allergy & Precautions, H&P , NPO status , Patient's Chart, lab work & pertinent test results  Airway Mallampati: II TM Distance: >3 FB Neck ROM: Full    Dental  (+) Dental Advisory Given   Pulmonary neg pulmonary ROS,  breath sounds clear to auscultation        Cardiovascular hypertension, Pt. on medications Rhythm:Regular Rate:Normal     Neuro/Psych negative neurological ROS  negative psych ROS   GI/Hepatic negative GI ROS, Neg liver ROS,   Endo/Other  diabetes, Type 2, Oral Hypoglycemic Agents  Renal/GU negative Renal ROS     Musculoskeletal negative musculoskeletal ROS (+)   Abdominal   Peds  Hematology negative hematology ROS (+)   Anesthesia Other Findings   Reproductive/Obstetrics negative OB ROS                           Anesthesia Physical Anesthesia Plan  ASA: II  Anesthesia Plan: General   Post-op Pain Management:    Induction: Intravenous  Airway Management Planned: LMA  Additional Equipment:   Intra-op Plan:   Post-operative Plan: Extubation in OR  Informed Consent: I have reviewed the patients History and Physical, chart, labs and discussed the procedure including the risks, benefits and alternatives for the proposed anesthesia with the patient or authorized representative who has indicated his/her understanding and acceptance.   Dental advisory given  Plan Discussed with: CRNA  Anesthesia Plan Comments:         Anesthesia Quick Evaluation

## 2013-07-01 NOTE — Transfer of Care (Signed)
Immediate Anesthesia Transfer of Care Note  Patient: Kiara Kim  Procedure(s) Performed: Procedure(s) (LRB): DILATATION AND CURETTAGE /HYSTEROSCOPY WITH POLYPECTOMY (N/A)  Patient Location: PACU  Anesthesia Type: General  Level of Consciousness: awake, oriented, sedated and patient cooperative  Airway & Oxygen Therapy: Patient Spontanous Breathing and Patient connected to face mask oxygen  Post-op Assessment: Report given to PACU RN and Post -op Vital signs reviewed and stable  Post vital signs: Reviewed and stable  Complications: No apparent anesthesia complications

## 2013-07-01 NOTE — Op Note (Signed)
OPERATIVE NOTE  Preoperative diagnosis: Endometrial polyp Postoperative diagnosis: Endometrial polyp Procedure: Hysteroscopy, polypectomy, dilation and curettage Surgeon: Fermin SchwabYALCINKAYA,Mujtaba Bollig  Anesthesia: General  Complications: None  Estimated blood loss: Less than 20 mL  Specimen: Endometrial polyp and curettings to pathology  Findings: Endocervical canal appeared normal. There was a lower uterine segment 1 x 1 cm polypoid mass with attachment to the left lateral wall. In addition there was a polypoid protuberance at midline in the anterior uterine wall. Both tubal ostia were seen. The uterus sounded to 12 cm.  Description of procedure: Patient was placed in dorsal supine position. General anesthesia was administered. She was placed in lithotomy position. She was prepped and draped in sterile manner. A vaginal speculum was placed. A dilute vasopressin solution containing 0.33 units per milliliter was injected into the cervical stroma x5 cc. A Slimline hysteroscope with 12 lens was inserted into the canal and above findings were noted. Distention medium was normal saline. Distention method was a hysteroscopic pump set at 80 mm mercury. Above findings were noted. Using hysteroscopic scissors, the lower uterine segment polyp was excised.  Next I dilated the cervix to [address] [place] with Pratt dilators and reinserted the hysteroscope and grasped the polyp and removed it with hysteroscopic graspers and submitted it to pathology. A sharp curet was used to curet the endometrial lining and remove the polypoid areas of the endometrium. This tissue was also submitted to pathology. The patient tolerated the procedure well and was transferred to recovery in satisfactory condition.   Fermin SchwabYALCINKAYA,Keyon Liller

## 2013-07-01 NOTE — Anesthesia Postprocedure Evaluation (Signed)
Anesthesia Post Note  Patient: Kiara Kim  Procedure(s) Performed: Procedure(s) (LRB): DILATATION AND CURETTAGE /HYSTEROSCOPY WITH POLYPECTOMY (N/A)  Anesthesia type: General  Patient location: PACU  Post pain: Pain level controlled  Post assessment: Post-op Vital signs reviewed  Last Vitals: BP 122/83  Pulse 87  Temp(Src) 36.3 C (Oral)  Resp 10  Ht 5\' 9"  (1.753 m)  Wt 203 lb (92.08 kg)  BMI 29.96 kg/m2  SpO2 93%  LMP 06/09/2013  Post vital signs: Reviewed   Level of consciousness: Awake and alert   Complications: Aspiration of gastric contents in OR. Volume of aspiration likely <15 mL. Pt sitting up in chair comfortably, no tachypnea or tachycardia. Lungs CTAB, no wheezing or rhonchi. Pt coughing infrequently, but states it was more from clearing her sore throat from intubation.  SpO2 between 94-98% in the ~8410minutes I was speaking with the patient and her husband (Preop 98%). Giving albuterol neb. Discussed risks of worsening pulmonary status at this point is very low risk. I feel she is safe for discharge once 3 hrs. Postop if she remains asymptomatic. Discussed with Dr. Rica MastFortune who agreed with my assessment.

## 2013-07-01 NOTE — Anesthesia Procedure Notes (Signed)
Procedure Name: LMA Insertion Date/Time: 07/01/2013 12:49 PM Performed by: Renella CunasHAZEL, Lynnleigh Soden D Pre-anesthesia Checklist: Patient identified, Emergency Drugs available, Suction available and Patient being monitored Patient Re-evaluated:Patient Re-evaluated prior to inductionOxygen Delivery Method: Circle System Utilized Preoxygenation: Pre-oxygenation with 100% oxygen Intubation Type: IV induction Ventilation: Mask ventilation without difficulty LMA: LMA inserted LMA Size: 4.0 Number of attempts: 1 Airway Equipment and Method: bite block Placement Confirmation: positive ETCO2 Tube secured with: Tape Dental Injury: Teeth and Oropharynx as per pre-operative assessment

## 2013-07-01 NOTE — Procedures (Signed)
Chest xray done and report called to Dr.Fortune.  Dr.Fortune contacted surgeon and it was decided that the patient would be admitted to main hospital for observation. Phone call for bed placement.

## 2013-07-01 NOTE — Progress Notes (Signed)
Dr. Renold DonGermeroth in to check patient in phase 2.. He listened to her lungs and spoke with patient and her husband. Suggested that patient stay at least another hour and re-evaluate her 02 status.

## 2013-07-01 NOTE — Progress Notes (Signed)
Dr. Rica MastFortune in and listened to patients chest and requested that we call him when an hour had passed and then a decision would be made as to keep patient overnight in the hospital.

## 2013-07-01 NOTE — Progress Notes (Signed)
Chest xray reveals patchy infiltrate and mild changes consistent with aspiration (R>L). SaO2% continues to remain in mid to upper 80's on room air. Faint expiratory wheeze noted on exam at right base. Discussed patients status with surgeon and agree to admit for overnight observation with pulse oximetry and O2 support at 2L per minute per .

## 2013-07-02 ENCOUNTER — Encounter (HOSPITAL_BASED_OUTPATIENT_CLINIC_OR_DEPARTMENT_OTHER): Payer: Self-pay | Admitting: Obstetrics and Gynecology

## 2013-07-02 NOTE — Care Management Note (Signed)
    Page 1 of 1   07/02/2013     12:00:55 PM   CARE MANAGEMENT NOTE 07/02/2013  Patient:  Perfecto KingdomBUTLER,Mekia E   Account Number:  [id number]  Date Initiated:  07/02/2013  Documentation initiated by:  Lorenda IshiharaPEELE,Evian Salguero  Subjective/Objective Assessment:   43 yo female admitted s/p hysteroscopy, desating post op, infiltrates on CXR. PTA lived at home with spouse.     Action/Plan:   Home when stable   Anticipated DC Date:  07/02/2013   Anticipated DC Plan:  HOME/SELF CARE      DC Planning Services  CM consult      Choice offered to / List presented to:             Status of service:  Completed, signed off Medicare Important Message given?   (If response is "NO", the following Medicare IM given date fields will be blank) Date Medicare IM given:   Date Additional Medicare IM given:    Discharge Disposition:  HOME/SELF CARE  Per UR Regulation:  Reviewed for med. necessity/level of care/duration of stay  If discussed at Long Length of Stay Meetings, dates discussed:    Comments:

## 2013-07-02 NOTE — Progress Notes (Signed)
Postop Visit  Pt resting comfortably in bed, no acute distress. Husband at bedside. No tachypnea. Pt states she does have some issues with SOB with exertion. Has been up and around today. I discussed the effects of and what to expect during recovery of an aspiration event.  Filed Vitals:   07/02/13 1000  BP: 116/66  Pulse: 110  Temp: 36.9 C  Resp: 16   Pulse oximetry on 2 LNC air is 94%  Will continue to follow.

## 2013-08-13 NOTE — Discharge Summary (Signed)
Physician Discharge Summary  Patient ID: Perfecto KingdomVanessa E Roldan MRN: [id number] DOB/AGE: [birth year] 41 y.o.  Admit date: 07/01/2013 Discharge date: 07/02/13  Admission Diagnoses: uterine polyp  Discharge Diagnoses:  Active Problems:   Uterine polyp   Polyp, corpus uteri   Discharged Condition: good  Hospital Course: The patient had an episode of emesis intraoperatively while she was under general anesthesia, pulmonary rales were heard in the recovery room, and oxygen saturation on room air was suboptimal, therefore she was observed per staff anesthesiologist. She remained stable upon observation, no progression to aspiration pneumonia was evident.  Consults: None  Significant Diagnostic Studies: radiology: X-Ray: Chest  Treatments: IV hydration  Discharge Exam: Blood pressure 127/75, pulse 107, temperature 98.3 F (36.8 C), temperature source Oral, resp. rate 18, height 5\' 9"  (1.753 m), weight 92.08 kg (203 lb), last menstrual period 06/09/2013, SpO2 94.00%. General appearance: moderately obese   Disposition: 01-Home or Self Care     Medication List         amLODipine 5 MG tablet  Commonly known as:  NORVASC  Take 5 mg by mouth every morning.     CONCEPT DHA 53.5-38-1 MG Caps  TAKE 1 CAPSULE DAILY     folic acid 1 MG tablet  Commonly known as:  FOLVITE  Take 1 mg by mouth daily.     insulin pump Soln  - Inject into the skin every 4 (four) hours. HUMOLOG--  TOTAL 68.9 UNITS PER DAY  -                        Q4 HR INCREMENTS FROM 12 AM TO 3PM ---  3.1  -                        4PM -- 2.4     -                        7PM -- 2.9     labetalol 200 MG tablet  Commonly known as:  NORMODYNE  Take 200 mg by mouth 2 (two) times daily.     metFORMIN 1000 MG tablet  Commonly known as:  GLUCOPHAGE  Take 1,000 mg by mouth 2 (two) times daily with a meal.     methyldopa 500 MG tablet  Commonly known as:  ALDOMET  Take 500 mg by mouth 2 (two) times daily.            Follow-up Information   Follow up with Fermin SchwabYALCINKAYA,Amaiya Scruton, MD. Call in 1 week.   Specialty:  Obstetrics and Gynecology   Contact information:   [address] [place] [address] [phone]       Signed: Fermin SchwabYALCINKAYA,Earline Stiner 08/13/2013, 8:13 PM

## 2013-10-02 ENCOUNTER — Other Ambulatory Visit: Payer: Self-pay | Admitting: Gynecology

## 2013-10-20 ENCOUNTER — Ambulatory Visit (HOSPITAL_COMMUNITY)
Admission: RE | Admit: 2013-10-20 | Discharge: 2013-10-20 | Disposition: A | Discharge status: Home / Self Care | Payer: 59 | Source: Ambulatory Visit | Attending: Obstetrics and Gynecology | Admitting: Obstetrics and Gynecology

## 2013-10-20 DIAGNOSIS — O24919 Unspecified diabetes mellitus in pregnancy, unspecified trimester: Secondary | ICD-10-CM

## 2013-10-20 NOTE — Consult Note (Signed)
Maternal Fetal Medicine Consultation  Requesting Provider(s): Fermin Schwabamer Yalcinkaya, MD   Reason for consultation: Type 2 diabetes on insulin pump, advanced maternal age, chronic hypertension  HPI: Kiara Kim is a 43 yo [phone] seen for consultation due to a history type 2 diabetes and chronic hypertension.  The patient recently underwent embryo transfer on 3 May - awaiting a first trimester ultrasound.  Kiara Kim is currently on Metformin 100 mg BID and an Insulin pump - [facility]  She reports that her blood sugars have been well controlled - has not had a recent HbA1C.  She denies any known end-organ problems.  She had a recent opthalmology evaluation.  Kiara Kim also has a long history of hypertension.  She was previously on Labetalol and Aldomet - over the last year was also started on Amlodipine.  She has not yet established prenatal care with a provider.  She is without complaints today.  OB History: OB History   Grav Para Term Preterm Abortions TAB SAB Ect Mult Living   3 1 1  2  2   1       PMH:  Past Medical History  Diagnosis Date  . Infertility   . Polycystic ovarian disease   . Hypertension   . Insulin pump in place   . Uterine polyp   . Diabetes mellitus type 2, insulin dependent     FOLLOWED BY DR Lurene ShadowBALLEN  . Wears contact lenses     PSH:  Past Surgical History  Procedure Laterality Date  . Hysteroscopy  2005  . Cesarean section  05-23-2005  . Myomectomy abdominal approach  2005  . Hysteroscopy w/d&c N/A 07/01/2013    Procedure: DILATATION AND CURETTAGE /HYSTEROSCOPY WITH POLYPECTOMY;  Surgeon: Fermin Schwabamer Yalcinkaya, MD;  [facility] ;  Service: Gynecology;  Laterality: N/A;   Meds:  Current Outpatient Prescriptions on File Prior to Encounter  Medication Sig Dispense Refill  . amLODipine (NORVASC) 5 MG tablet Take 5 mg by mouth every morning.      . folic acid (FOLVITE) 1 MG tablet  Take 1 mg by mouth daily.      . Insulin Human (INSULIN PUMP) 100 unit/ml SOLN Inject into the skin every 4 (four) hours. HUMOLOG--  TOTAL 68.9 UNITS PER DAY                        Q4 HR INCREMENTS FROM 12 AM TO 3PM ---  3.1                        4PM -- 2.4                           7PM -- 2.9      . labetalol (NORMODYNE) 200 MG tablet Take 200 mg by mouth 2 (two) times daily.      . metFORMIN (GLUCOPHAGE) 1000 MG tablet Take 1,000 mg by mouth 2 (two) times daily with a meal.      . methyldopa (ALDOMET) 500 MG tablet Take 500 mg by mouth 2 (two) times daily.      . Prenat-FeFum-FePo-FA-Omega 3 (CONCEPT DHA) 53.5-38-1 MG CAPS TAKE 1 CAPSULE DAILY  90 capsule  2   No current facility-administered medications on file prior to encounter.   Allergies: No Known Allergies  FH:  Family History  Problem Relation Age of Onset  .  Diabetes Mother   . Hypertension Mother   . Cancer Mother     age 41  . Hypertension Maternal Grandmother   . Diabetes Paternal Grandmother   . Diabetes Paternal Grandfather   . Diabetes Father    Soc:  History   Social History  . Marital Status: Married    Spouse Name: N/A    Number of Children: N/A  . Years of Education: N/A   Occupational History  . Not on file.   Social History Main Topics  . Smoking status: Never Smoker   . Smokeless tobacco: Never Used  . Alcohol Use: No  . Drug Use: No  . Sexual Activity: Yes    Partners: Male    Birth Control/ Protection: None   Other Topics Concern  . Not on file   Social History Narrative  . No narrative on file    Review of Systems: no vaginal bleeding or cramping/contractions, no LOF, no nausea/vomiting. All other systems reviewed and are negative.  PE:  128/72, 207#, 89   A/P: 1) Type 2 diabetes on insulin pump - currently managed by Dr[facility]Ballen [facility]  Per patient report, she is well-controlled         2) Chronic hypertension - the patient is currently on 3 different  anti-hypertensive medications.  Her blood pressure is normal today.  We briefly discussed her increased risk for developing preeclampsia / gestational hypertension due to multiple BP meds, diabetes and advanced maternal age.           3) History of prior myomectomy         4) History of prior C-section         5) Advanced maternal age > 40  Recommendations: (once prenatal care is established) 1) If not recently performed, recommend baseline preeclampsia labs including 24 hr urine protein / Creatinine clearance 2) Would check HbA1C if not recently performed 3) Would offer Genetic counseling, NIPS at 10-12 weeks due to advanced maternal age 354) Detailed ultrasound for anatomy at 18-20 weeks 5) Fetal echo at 20-[redacted] weeks gestation 6) Would follow BPs closely - if well controlled, may consider stopping Aldomet (may be difficult to continue 3 blood pressure medications).  If BPs become difficult to control, would titrate Labetalol dose upward - may increase to a max dose of 800 mg TID if needed 7) Would attempt to get operative note from previous myomectomy.  If the uterine cavity was entered, would recommend a scheduled repeat C-section at 37 weeks to decrease the risk of uterine rupture. 8) Recommend serial growth scans every 4 weeks in the 3rd trimester 9) Antepartum fetal testing beginning at 32 weeks   Thank you for the opportunity to be a part of the care of Kiara Kim. Please contact our office if we can be of further assistance.   I spent approximately 30 minutes with this patient with over 50% of time spent in face-to-face counseling.  Alpha GulaPaul Marabelle Cushman, MD Maternal Fetal Medicine

## 2013-11-02 HISTORY — PX: DILATION AND CURETTAGE OF UTERUS: SHX78

## 2013-11-20 ENCOUNTER — Other Ambulatory Visit: Payer: Self-pay

## 2013-11-20 DIAGNOSIS — Z1231 Encounter for screening mammogram for malignant neoplasm of breast: Secondary | ICD-10-CM

## 2013-12-02 ENCOUNTER — Ambulatory Visit: Payer: 59

## 2013-12-10 ENCOUNTER — Ambulatory Visit
Admission: RE | Admit: 2013-12-10 | Discharge: 2013-12-10 | Disposition: A | Discharge status: Home / Self Care | Payer: 59 | Source: Ambulatory Visit

## 2013-12-10 DIAGNOSIS — Z1231 Encounter for screening mammogram for malignant neoplasm of breast: Secondary | ICD-10-CM

## 2013-12-22 ENCOUNTER — Encounter: Payer: Self-pay | Admitting: Gynecology

## 2014-04-05 ENCOUNTER — Encounter (HOSPITAL_BASED_OUTPATIENT_CLINIC_OR_DEPARTMENT_OTHER): Payer: Self-pay | Admitting: Obstetrics and Gynecology

## 2014-06-06 ENCOUNTER — Other Ambulatory Visit: Payer: Self-pay | Admitting: Gynecology

## 2014-07-09 ENCOUNTER — Encounter: Payer: Self-pay | Admitting: Gynecology

## 2014-07-09 ENCOUNTER — Ambulatory Visit (INDEPENDENT_AMBULATORY_CARE_PROVIDER_SITE_OTHER): Payer: 59 | Admitting: Gynecology

## 2014-07-09 VITALS — BP 130/88 | Ht 68.0 in | Wt 209.0 lb

## 2014-07-09 DIAGNOSIS — Z01419 Encounter for gynecological examination (general) (routine) without abnormal findings: Secondary | ICD-10-CM

## 2014-07-09 DIAGNOSIS — N760 Acute vaginitis: Secondary | ICD-10-CM

## 2014-07-09 LAB — WET PREP FOR TRICH, YEAST, CLUE: TRICH WET PREP: NONE SEEN

## 2014-07-09 MED ORDER — TINIDAZOLE 500 MG PO TABS: ORAL_TABLET | ORAL | Status: DC

## 2014-07-09 MED ORDER — TERCONAZOLE 0.4 % VA CREA: 1.0000 | TOPICAL_CREAM | Freq: Every day | VAGINAL | Status: DC

## 2014-07-09 MED ORDER — FLUCONAZOLE 150 MG PO TABS: 150.0000 mg | ORAL_TABLET | Freq: Once | ORAL | Status: DC

## 2014-07-09 NOTE — Patient Instructions (Signed)
Candidal Vulvovaginitis Candidal vulvovaginitis is an infection of the vagina and vulva. The vulva is the skin around the opening of the vagina. This may cause itching and discomfort in and around the vagina.  HOME CARE  Only take medicine as told by your doctor.  Do not have sex (intercourse) until the infection is healed or as told by your doctor.  Practice safe sex.  Tell your sex partner about your infection.  Do not douche or use tampons.  Wear cotton underwear. Do not wear tight pants or panty hose.  Eat yogurt. This may help treat and prevent yeast infections. GET HELP RIGHT AWAY IF:   You have a fever.  Your problems get worse during treatment or do not get better in 3 days.  You have discomfort, irritation, or itching in your vagina or vulva area.  You have pain after sex.  You start to get belly (abdominal) pain. MAKE SURE YOU:  Understand these instructions.  Will watch your condition.  Will get help right away if you are not doing well or get worse. Document Released: 08/17/2008 Document Revised: 05/26/2013 Document Reviewed: 08/17/2008 [facility] Patient Information 2015 [place], [place]. This information is not intended to replace advice given to you by your health care provider. Make sure you discuss any questions you have with your health care provider. Bacterial Vaginosis Bacterial vaginosis is a vaginal infection that occurs when the normal balance of bacteria in the vagina is disrupted. It results from an overgrowth of certain bacteria. This is the most common vaginal infection in women of childbearing age. Treatment is important to prevent complications, especially in pregnant women, as it can cause a premature delivery. CAUSES  Bacterial vaginosis is caused by an increase in harmful bacteria that are normally present in smaller amounts in the vagina. Several different kinds of bacteria can cause bacterial vaginosis. However, the reason that the condition  develops is not fully understood. RISK FACTORS Certain activities or behaviors can put you at an increased risk of developing bacterial vaginosis, including:  Having a new sex partner or multiple sex partners.  Douching.  Using an intrauterine device (IUD) for contraception. Women do not get bacterial vaginosis from toilet seats, bedding, swimming pools, or contact with objects around them. SIGNS AND SYMPTOMS  Some women with bacterial vaginosis have no signs or symptoms. Common symptoms include:  Grey vaginal discharge.  A fishlike odor with discharge, especially after sexual intercourse.  Itching or burning of the vagina and vulva.  Burning or pain with urination. DIAGNOSIS  Your health care provider will take a medical history and examine the vagina for signs of bacterial vaginosis. A sample of vaginal fluid may be taken. Your health care provider will look at this sample under a microscope to check for bacteria and abnormal cells. A vaginal pH test may also be done.  TREATMENT  Bacterial vaginosis may be treated with antibiotic medicines. These may be given in the form of a pill or a vaginal cream. A second round of antibiotics may be prescribed if the condition comes back after treatment.  HOME CARE INSTRUCTIONS   Only take over-the-counter or prescription medicines as directed by your health care provider.  If antibiotic medicine was prescribed, take it as directed. Make sure you finish it even if you start to feel better.  Do not have sex until treatment is completed.  Tell all sexual partners that you have a vaginal infection. They should see their health care provider and be treated if they  have problems, such as a mild rash or itching.  Practice safe sex by using condoms and only having one sex partner. SEEK MEDICAL CARE IF:   Your symptoms are not improving after 3 days of treatment.  You have increased discharge or pain.  You have a fever. MAKE SURE YOU:    Understand these instructions.  Will watch your condition.  Will get help right away if you are not doing well or get worse. FOR MORE INFORMATION  Centers for Disease Control and Prevention, Division of STD Prevention: SolutionApps.co.zawww.cdc.gov/std American Sexual Health Association (ASHA): www.ashastd.org  Document Released: 05/21/2005 Document Revised: 03/11/2013 Document Reviewed: 12/31/2012 [facility] Patient Information 2015 [place]. This information is not intended to replace advice given to you by your health care provider. Make sure you discuss any questions you have with your health care provider. Tinidazole tablets What is this medicine? TINIDAZOLE (tye NI da zole) is an antiinfective. It is used to treat amebiasis, giardiasis, trichomoniasis, and vaginosis. It will not work for colds, flu, or other viral infections. This medicine may be used for other purposes; ask your health care provider or pharmacist if you have questions. COMMON BRAND NAME(S): Tindamax What should I tell my health care provider before I take this medicine? They need to know if you have any of these conditions: -anemia or other blood disorders -if you frequently drink alcohol containing drinks -receiving hemodialysis -seizure disorder -an unusual or allergic reaction to tinidazole, other medicines, foods, dyes, or preservatives -pregnant or trying to get pregnant -breast-feeding How should I use this medicine? Take this medicine by mouth with a full glass of water. Follow the directions on the prescription label. Take with food. Take your medicine at regular intervals. Do not take your medicine more often than directed. Take all of your medicine as directed even if you think you are better. Do not skip doses or stop your medicine early. Talk to your pediatrician regarding the use of this medicine in children. While this drug may be prescribed for children as young as 763 years of age for selected conditions,  precautions do apply. Overdosage: If you think you have taken too much of this medicine contact a poison control center or emergency room at once. NOTE: This medicine is only for you. Do not share this medicine with others. What if I miss a dose? If you miss a dose, take it as soon as you can. If it is almost time for your next dose, take only that dose. Do not take double or extra doses. What may interact with this medicine? Do not take this medicine with any of the following medications: -alcohol or any product that contains alcohol -amprenavir oral solution -disulfiram -paclitaxel injection -ritonavir oral solution -sertraline oral solution -sulfamethoxazole-trimethoprim injection This medicine may also interact with the following medications: -cholestyramine -cimetidine -conivaptan -cyclosporin -fluorouracil -fosphenytoin, phenytoin -ketoconazole -lithium -phenobarbital -tacrolimus -warfarin This list may not describe all possible interactions. Give your health care provider a list of all the medicines, herbs, non-prescription drugs, or dietary supplements you use. Also tell them if you smoke, drink alcohol, or use illegal drugs. Some items may interact with your medicine. What should I watch for while using this medicine? Tell your doctor or health care professional if your symptoms do not improve or if they get worse. Avoid alcoholic drinks while you are taking this medicine and for three days afterward. Alcohol may make you feel dizzy, sick, or flushed. If you are being treated for a sexually transmitted  disease, avoid sexual contact until you have finished your treatment. Your sexual partner may also need treatment. What side effects may I notice from receiving this medicine? Side effects that you should report to your doctor or health care professional as soon as possible: -allergic reactions like skin rash, itching or hives, swelling of the face, lips, or tongue -breathing  problems -confusion, depression -dark or white patches in the mouth -feeling faint or lightheaded, falls -fever, infection -numbness, tingling, pain or weakness in the hands or feet -pain when passing urine -seizures -unusually weak or tired -vaginal irritation or discharge -vomiting Side effects that usually do not require medical attention (report to your doctor or health care professional if they continue or are bothersome): -dark brown or reddish urine -diarrhea -headache -loss of appetite -metallic taste -nausea -stomach upset This list may not describe all possible side effects. Call your doctor for medical advice about side effects. You may report side effects to FDA at 1-800-FDA-1088. Where should I keep my medicine? Keep out of the reach of children. Store at room temperature between 15 and 30 degrees C (59 and 86 degrees F). Protect from light and moisture. Keep container tightly closed. Throw away any unused medicine after the expiration date. NOTE: This sheet is a summary. It may not cover all possible information. If you have questions about this medicine, talk to your doctor, pharmacist, or health care provider.  2015, Elsevier/Gold Standard. (2008-02-16 15:22:28)

## 2014-07-09 NOTE — Progress Notes (Signed)
Kiara Kim [birth year] [id number]   History:    44 y.o.  for annual gyn exam with complaint of vulvovaginal pruritus. Patient is a type II diabetic on insulin pump and has been followed by her endocrinologist Dr. Lurene Shadow. She is also been followed by Dr. Fermin Schwab reproductive endocrinologist as a result of patient secondary infertility. She has had several frozen embryo transplants and is result of either miscarriage or D&E. January 2015 before the transfer patient had resectoscopic polypectomy by Dr. Fermin Schwab. Patient with no past history of any abnormal Pap smear.The patient's only successful pregnancy was the ovulation induction and sperm washing and IUI. Patient's husband has been followed by Dr. Patsi Sears urologist. He has female factor infertility and has had a varicocele repair in the past and has an absent right testes, and low sperm count. Her last child was delivered 9 years ago.  Past medical history,surgical history, family history and social history were all reviewed and documented in the EPIC chart.  Gynecologic History Patient's last menstrual period was 05/28/2014. Contraception: none Last Pap: 2015. Results were: normal Last mammogram: 2015. Results were: normal  Obstetric History OB History  Gravida Para Term Preterm AB SAB TAB Ectopic Multiple Living  # Outcome Date GA Lbr Len/2nd Weight Sex Delivery Anes PTL Lv  3 SAB           2 SAB           1 Term     F CS-Unspec  N Y       ROS: A ROS was performed and pertinent positives and negatives are included in the history.  GENERAL: No fevers or chills. HEENT: No change in vision, no earache, sore throat or sinus congestion. NECK: No pain or stiffness. CARDIOVASCULAR: No chest pain or pressure. No palpitations. PULMONARY: No shortness of breath, cough or wheeze. GASTROINTESTINAL: No abdominal pain, nausea, vomiting or diarrhea, melena or bright red blood per rectum. GENITOURINARY: No  urinary frequency, urgency, hesitancy or dysuria. MUSCULOSKELETAL: No joint or muscle pain, no back pain, no recent trauma. DERMATOLOGIC: No rash, no itching, no lesions. ENDOCRINE: No polyuria, polydipsia, no heat or cold intolerance. No recent change in weight. HEMATOLOGICAL: No anemia or easy bruising or bleeding. NEUROLOGIC: No headache, seizures, numbness, tingling or weakness. PSYCHIATRIC: No depression, no loss of interest in normal activity or change in sleep pattern.     Exam: chaperone present  BP 130/88 mmHg  Ht  (1.727 m)  Wt 209 lb (94.802 kg)  BMI 31.79 kg/m2  LMP 05/28/2014  Body mass index is 31.79 kg/(m^2).  General appearance : Well developed well nourished female. No acute distress HEENT: Neck supple, trachea midline, no carotid bruits, no thyroidmegaly Lungs: Clear to auscultation, no rhonchi or wheezes, or rib retractions  Heart: Regular rate and rhythm, no murmurs or gallops Breast:Examined in sitting and supine position were symmetrical in appearance, no palpable masses or tenderness,  no skin retraction, no nipple inversion, no nipple discharge, no skin discoloration, no axillary or supraclavicular lymphadenopathy Abdomen: no palpable masses or tenderness, no rebound or guarding Extremities: no edema or skin discoloration or tenderness  Pelvic:  Excoriated erythematous external genitalia  Bartholin, Urethra, Skene Glands: Within normal limits             Vagina: No gross lesions or discharge  Cervix: No gross lesions or discharge  Uterus  anteverted, normal size, shape and  consistency, non-tender and mobile  Adnexa  Without masses or tenderness  Anus and perineum  normal   Rectovaginal  normal sphincter tone without palpated masses or tenderness             Hemoccult not indicated   Wet prep: Few yeast, positive amine, moderate clue cell, moderate WBC, too numerous to count bacteria  Assessment/Plan:  44 y.o. female for annual exam with secondary  infertility as described above we'll continue to follow-up with reproductive endocrinologist Dr. Fermin Schwabamer Yalcinkaya. Patient's Pap smears are up-to-date. Mammogram up-to-date. Flu vaccine up-to-date.For her bacterial vaginosis she will be prescribed Tindamax 500 mg patient to take 4 tablets today and repeat in 24 hours. For her vulvovaginitis she will be prescribed Terazol 7 to apply intravaginally and topically before bedtime for one week. Because of her being diabetic and history of recurrent yeast infections she'll be prescribed Diflucan to have for the future.   Ok EdwardsFERNANDEZ,Kiara Kim, 3:24 PM 07/09/2014

## 2014-09-04 ENCOUNTER — Other Ambulatory Visit: Payer: Self-pay | Admitting: Gynecology

## 2014-12-04 ENCOUNTER — Other Ambulatory Visit: Payer: Self-pay | Admitting: Gynecology

## 2014-12-14 ENCOUNTER — Other Ambulatory Visit: Payer: Self-pay

## 2014-12-14 DIAGNOSIS — Z1231 Encounter for screening mammogram for malignant neoplasm of breast: Secondary | ICD-10-CM

## 2014-12-22 ENCOUNTER — Ambulatory Visit
Admission: RE | Admit: 2014-12-22 | Discharge: 2014-12-22 | Disposition: A | Discharge status: Home / Self Care | Payer: 59 | Source: Ambulatory Visit

## 2014-12-22 DIAGNOSIS — Z1231 Encounter for screening mammogram for malignant neoplasm of breast: Secondary | ICD-10-CM

## 2015-03-05 ENCOUNTER — Other Ambulatory Visit: Payer: Self-pay | Admitting: Gynecology

## 2015-07-13 ENCOUNTER — Ambulatory Visit (INDEPENDENT_AMBULATORY_CARE_PROVIDER_SITE_OTHER): Payer: 59 | Admitting: Gynecology

## 2015-07-13 ENCOUNTER — Other Ambulatory Visit (HOSPITAL_COMMUNITY)
Admission: RE | Admit: 2015-07-13 | Discharge: 2015-07-13 | Disposition: A | Discharge status: Home / Self Care | Payer: 59 | Source: Ambulatory Visit | Attending: Gynecology | Admitting: Gynecology

## 2015-07-13 ENCOUNTER — Encounter: Payer: Self-pay | Admitting: Gynecology

## 2015-07-13 VITALS — BP 148/82 | Ht 68.0 in | Wt 217.0 lb

## 2015-07-13 DIAGNOSIS — Z01411 Encounter for gynecological examination (general) (routine) with abnormal findings: Secondary | ICD-10-CM | POA: Diagnosis not present

## 2015-07-13 DIAGNOSIS — Z01419 Encounter for gynecological examination (general) (routine) without abnormal findings: Secondary | ICD-10-CM

## 2015-07-13 DIAGNOSIS — N911 Secondary amenorrhea: Secondary | ICD-10-CM

## 2015-07-13 LAB — PROLACTIN: Prolactin: 7.5 ng/mL

## 2015-07-13 LAB — PREGNANCY, URINE: Preg Test, Ur: NEGATIVE

## 2015-07-13 LAB — FOLLICLE STIMULATING HORMONE: FSH: 3.5 m[IU]/mL

## 2015-07-13 LAB — TSH: TSH: 1.89 mIU/L

## 2015-07-13 NOTE — Progress Notes (Signed)
Kiara Kim 1970/11/01 [id number]   History:    45 y.o.  for annual gyn exam who complains of no menstrual cycle since November of last year. Patient denies any nausea, vomiting, breast tenderness. She denies any nipple discharge or any unusual headaches or double vision. Patient with long-standing history of secondary infertility please see previous notes for detail. Patient is being followed by medical endocrinologist Dr. Lurene Shadow for patient's type 2 diabetes for which she is on an insulin pump as well as oral hypoglycemic agents. Dr. Jeanella Anton is her new PCP. Patient with no past history of any abnormal Pap smears in the past.  Past medical history,surgical history, family history and social history were all reviewed and documented in the EPIC chart.  Gynecologic History Patient's last menstrual period was 04/27/2015. Contraception: none Last Pap: 2013 2015. Results were: normal Last mammogram: 2016. Results were: normal  Obstetric History OB History  Gravida Para Term Preterm AB SAB TAB Ectopic Multiple Living  # Outcome Date GA Lbr Len/2nd Weight Sex Delivery Anes PTL Lv  3 SAB           2 SAB           1 Term     F CS-Unspec  N Y       ROS: A ROS was performed and pertinent positives and negatives are included in the history.  GENERAL: No fevers or chills. HEENT: No change in vision, no earache, sore throat or sinus congestion. NECK: No pain or stiffness. CARDIOVASCULAR: No chest pain or pressure. No palpitations. PULMONARY: No shortness of breath, cough or wheeze. GASTROINTESTINAL: No abdominal pain, nausea, vomiting or diarrhea, melena or bright red blood per rectum. GENITOURINARY: No urinary frequency, urgency, hesitancy or dysuria. MUSCULOSKELETAL: No joint or muscle pain, no back pain, no recent trauma. DERMATOLOGIC: No rash, no itching, no lesions. ENDOCRINE: No polyuria, polydipsia, no heat or cold intolerance. No recent change in weight. HEMATOLOGICAL:  No anemia or easy bruising or bleeding. NEUROLOGIC: No headache, seizures, numbness, tingling or weakness. PSYCHIATRIC: No depression, no loss of interest in normal activity or change in sleep pattern.     Exam: chaperone present  BP 148/82 mmHg  Ht  (1.727 m)  Wt 217 lb (98.431 kg)  BMI 33.00 kg/m2  LMP 04/27/2015  Body mass index is 33 kg/(m^2).  General appearance : Well developed well nourished female. No acute distress HEENT: Eyes: no retinal hemorrhage or exudates,  Neck supple, trachea midline, no carotid bruits, no thyroidmegaly Lungs: Clear to auscultation, no rhonchi or wheezes, or rib retractions  Heart: Regular rate and rhythm, no murmurs or gallops Breast:Examined in sitting and supine position were symmetrical in appearance, no palpable masses or tenderness,  no skin retraction, no nipple inversion, no nipple discharge, no skin discoloration, no axillary or supraclavicular lymphadenopathy Abdomen: no palpable masses or tenderness, no rebound or guarding Extremities: no edema or skin discoloration or tenderness  Pelvic: Upper portion of of right labia majora a flat dark pigmented lesion was noted also inferior portion of right labial minora and fourchette flat leukoplakic areas  Bartholin, Urethra, Skene Glands: Within normal limits             Vagina: No gross lesions or discharge  Cervix: No gross lesions or discharge  Uterus  anteverted, normal size, shape and consistency, non-tender and mobile  Adnexa  Without masses or tenderness  Anus and  perineum  normal   Rectovaginal  normal sphincter tone without palpated masses or tenderness             Hemoccult not indicated   Urine pregnancy test negative.  Assessment/Plan:  45 y.o. female for annual exam with incidental findings of pigmented area of right labia majora as well as leukoplakic area inferior portion of right labia minora and fourchette. Patient to return back to the office in 1-2 weeks for colposcopic  evaluation of this area and possible biopsy. Pap smear was done today. Patient with secondary amenorrhea probably attributed to her weight and her diabetes. We are going to check a TSH, prolactin and FSH today. If these results are normal she will be prescribed Provera 10 mg to take 1 by mouth daily for 10 days of the month and thereafter to 35 days if no spontaneous menses in patient does a home pregnancy test prior to taking Provera. Her PCP has been doing the remainder her blood work.   Ok Edwards MD, 11:05 AM 07/13/2015

## 2015-07-13 NOTE — Addendum Note (Signed)
Addended by: Berna Spare A on: 07/13/2015 11:13 AM   Modules accepted: Orders, SmartSet

## 2015-07-14 LAB — CYTOLOGY - PAP

## 2015-08-17 ENCOUNTER — Ambulatory Visit: Payer: 59 | Admitting: Gynecology

## 2015-09-01 ENCOUNTER — Other Ambulatory Visit: Payer: Self-pay | Admitting: Gynecology

## 2015-10-19 ENCOUNTER — Ambulatory Visit: Payer: 59 | Admitting: Gynecology

## 2015-11-03 ENCOUNTER — Encounter: Payer: Self-pay | Admitting: Gynecology

## 2015-11-03 ENCOUNTER — Ambulatory Visit (INDEPENDENT_AMBULATORY_CARE_PROVIDER_SITE_OTHER): Payer: 59 | Admitting: Gynecology

## 2015-11-03 VITALS — BP 130/90

## 2015-11-03 DIAGNOSIS — N9089 Other specified noninflammatory disorders of vulva and perineum: Secondary | ICD-10-CM | POA: Diagnosis not present

## 2015-11-03 NOTE — Patient Instructions (Signed)

## 2015-11-03 NOTE — Progress Notes (Signed)
   Patient is a 45 year old who was seen the office for her annual gynecological examination February of this year and during her pelvic examination the following was noted:  Upper portion of of right labia majora a flat dark pigmented lesion was noted also inferior portion of right labial minora and fourchette flat leukoplakic areas Bartholin, Urethra, Skene Glands: Within normal limits  Vagina: No gross lesions or discharge Cervix: No gross lesions or discharge Uterus anteverted, normal size, shape and consistency, non-tender and mobile Adnexa Without masses or tenderness Anus and perineum normal  Rectovaginal normal sphincter tone without palpated masses or tenderness  She is here for colposcopic evaluation and biopsy:  Physical Exam  Genitourinary:      Patient underwent detail colposcopic evaluation of the external genitalia, perineum, perirectal region and the 2 areas noted above were respectively biopsy and tissue submitted for histological evaluation. Silver nitrate was used for hemostasis. Speculum exam was otherwise normal no vaginal cervical lesions noted. Patient's recent Pap smear was normal as well. We'll wait for results of the biopsy and manage accordingly.

## 2015-11-03 NOTE — Addendum Note (Signed)
Addended by: Berna SpareASTILLO, Chera Slivka A on: 11/03/2015 03:17 PM   Modules accepted: Orders

## 2015-11-07 ENCOUNTER — Encounter: Payer: Self-pay | Admitting: Gynecology

## 2015-11-07 ENCOUNTER — Other Ambulatory Visit: Payer: Self-pay | Admitting: Gynecology

## 2015-11-07 MED ORDER — CLOBETASOL PROPIONATE 0.05 % EX CREA: TOPICAL_CREAM | CUTANEOUS | Status: DC

## 2015-11-08 ENCOUNTER — Telehealth: Payer: Self-pay | Admitting: *Deleted

## 2015-11-08 MED ORDER — TRIAMCINOLONE ACETONIDE 0.5 % EX CREA: TOPICAL_CREAM | Freq: Two times a day (BID) | CUTANEOUS | Status: DC

## 2015-11-08 NOTE — Telephone Encounter (Signed)
Rx for temovate cream 0.05%  cream was sent to pharmacy for pt apply to biospy area on 11/03/15 OV. Pt said medication is $51, asked if something else could be prescribed? If no other options pt will pay for Rx. Please advise

## 2015-11-08 NOTE — Telephone Encounter (Signed)
Please steroid creams are typically in this Price range. She can try an over-the-counter steroid cream or Kenalog 0.5% to apply twice a day for 7-10 days

## 2015-11-08 NOTE — Telephone Encounter (Signed)
Pt aware, Rx sent. 

## 2016-06-26 ENCOUNTER — Institutional Professional Consult (permissible substitution): Payer: 59 | Admitting: Internal Medicine

## 2016-08-28 ENCOUNTER — Ambulatory Visit (INDEPENDENT_AMBULATORY_CARE_PROVIDER_SITE_OTHER): Payer: 59 | Admitting: Internal Medicine

## 2016-08-28 ENCOUNTER — Encounter: Payer: Self-pay | Admitting: Internal Medicine

## 2016-08-28 ENCOUNTER — Ambulatory Visit (INDEPENDENT_AMBULATORY_CARE_PROVIDER_SITE_OTHER)
Admission: RE | Admit: 2016-08-28 | Discharge: 2016-08-28 | Disposition: A | Discharge status: Home / Self Care | Payer: 59 | Source: Ambulatory Visit | Attending: Internal Medicine | Admitting: Internal Medicine

## 2016-08-28 VITALS — BP 124/80 | HR 90 | Ht 68.0 in | Wt 185.4 lb

## 2016-08-28 DIAGNOSIS — I10 Essential (primary) hypertension: Secondary | ICD-10-CM

## 2016-08-28 DIAGNOSIS — J45991 Cough variant asthma: Secondary | ICD-10-CM

## 2016-08-28 LAB — NITRIC OXIDE: Other: 7

## 2016-08-28 MED ORDER — BISOPROLOL FUMARATE 5 MG PO TABS: 5.0000 mg | ORAL_TABLET | Freq: Every day | ORAL | 11 refills | Status: DC

## 2016-08-28 MED ORDER — PANTOPRAZOLE SODIUM 40 MG PO TBEC: 40.0000 mg | DELAYED_RELEASE_TABLET | Freq: Every day | ORAL | 2 refills | Status: DC

## 2016-08-28 MED ORDER — FAMOTIDINE 20 MG PO TABS: ORAL_TABLET | ORAL | 11 refills | Status: DC

## 2016-08-28 MED ORDER — PREDNISONE 10 MG PO TABS: ORAL_TABLET | ORAL | 0 refills | Status: DC

## 2016-08-28 NOTE — Patient Instructions (Addendum)
Stop the normodyne and take bisoprolol 5 mg daily instead - if blood pressure too high can take twice daily   For cough  Or wheeze > proair 2 pffs every 4 hours as needed   Anytime you start coughing strongly rec: Protonix 40 mg Take 30-60 min before first meal of the day and Pepcid ac (famotidine) 20 mg one @  bedtime until cough is completely gone for at least a week without the need for cough suppression    GERD (REFLUX)  is an extremely common cause of respiratory symptoms just like yours , many times with no obvious heartburn at all.    It can be treated with medication, but also with lifestyle changes including elevation of the head of your bed (ideally with 6 inch  bed blocks),  Smoking cessation, avoidance of late meals, excessive alcohol, and avoid fatty foods, chocolate, peppermint, colas, red wine, and acidic juices such as orange juice.  NO MINT OR MENTHOL PRODUCTS SO NO COUGH DROPS   USE SUGARLESS CANDY INSTEAD (Jolley ranchers or Stover's or Life Savers) or even ice chips will also do - the key is to swallow to prevent all throat clearing. NO OIL BASED VITAMINS - use powdered substitutes.    Please remember to go to the  x-ray department downstairs in the basement  for your tests - we will call you with the results when they are available  If not better > Prednisone 10 mg take  4 each am x 2 days,   2 each am x 2 days,  1 each am x 2 days and stop .  If still not better return w/in 4 weeks

## 2016-08-28 NOTE — Progress Notes (Signed)
Subjective:     Patient ID: Kiara Kim, female   DOB: 05-31-1971,    MRN: [id number]  HPI  [age] yobf [place] up in [place] came to [place] in 1992 for college and stayed and since then tendency for head colds to chest twice a year with allergy eval by Dr Willa RoughHicks neg but rec ventolin prn seems to help a lot but keeps coming back twice a year so referred to pulmonary clinic 08/28/2016 by Dr   Talmage NapBalan      08/28/2016 1st [facility] office visit/ Kiara Kim  Recurrent cough on labetolol  Chief Complaint  Patient presents with  . Pulm Consult    Persistent cough. Non-productive cough. Feels a slight rattle in chest during coughing spells.    last episode was on Oct 2107 this was  worse started march 16th 2018 acutely> tried  just taking otcs cough meds no better  Worse when head hits pillow but present 24/7  No obvious other  day to day or daytime variability or assoc excess/ purulent sputum or mucus plugs or hemoptysis or cp or chest tightness, subjective wheeze or overt sinus or hb symptoms. No unusual exp hx or h/o childhood pna/ asthma or knowledge of premature birth.    Also denies any obvious fluctuation of symptoms with weather or environmental changes or other aggravating or alleviating factors except as outlined above   Current Medications, Allergies, Complete Past Medical History, Past Surgical History, Family History, and Social History were reviewed in [facility] electronic medical record.  ROS  The following are not active complaints unless bolded sore throat, dysphagia, dental problems, itching, sneezing,  nasal congestion or excess/ purulent secretions, ear ache,   fever, chills, sweats, unintended wt loss, classically pleuritic or exertional cp,  orthopnea pnd or leg swelling, presyncope, palpitations, abdominal pain, anorexia, nausea, vomiting, diarrhea  or change in bowel or bladder habits, change in stools or urine, dysuria,hematuria,  rash, arthralgias, visual complaints,  headache, numbness, weakness or ataxia or problems with walking or coordination,  change in mood/affect or memory.         Review of Systems     Objective:   Physical Exam    amb bf nad   Wt Readings from Last 3 Encounters:  08/28/16 185 lb 6.4 oz (84.1 kg)  07/13/15 217 lb (98.4 kg)  07/09/14 209 lb (94.8 kg)    Vital signs reviewed  - Note on arrival 02 sats  100% on RA      HEENT: nl dentition, turbinates bilaterally, and oropharynx. Nl external ear canals without cough reflex   NECK :  without JVD/Nodes/TM/ nl carotid upstrokes bilaterally   LUNGS: no acc muscle use,  Nl contour chest which is clear to A and P bilaterally without cough on insp or exp maneuvers   CV:  RRR  no s3 or murmur or increase in P2, and no edema   ABD:  soft and nontender with nl inspiratory excursion in the supine position. No bruits or organomegaly appreciated, bowel sounds nl  MS:  Nl gait/ ext warm without deformities, calf tenderness, cyanosis or clubbing No obvious joint restrictions   SKIN: warm and dry without lesions    NEURO:  alert, approp, nl sensorium with  no motor or cerebellar deficits apparent.        CXR PA and Lateral:   08/28/2016 :    I personally reviewed images and agree with radiology impression as follows:    No active cardiopulmonary disease. Assessment:

## 2016-08-29 ENCOUNTER — Other Ambulatory Visit: Payer: Self-pay | Admitting: Internal Medicine

## 2016-08-29 ENCOUNTER — Other Ambulatory Visit: Payer: Self-pay

## 2016-08-29 MED ORDER — ALBUTEROL SULFATE HFA 108 (90 BASE) MCG/ACT IN AERS: 2.0000 | INHALATION_SPRAY | Freq: Four times a day (QID) | RESPIRATORY_TRACT | 6 refills | Status: DC | PRN

## 2016-08-29 NOTE — Telephone Encounter (Signed)
Called pt to discuss CXR results. She stated that per her AVS, she is supposed to use Proair but it was not called into pharmacy. Reviewed AVS and saw that Dr. Sherene SiresWert does the patient to have this medication. Medication was sent to pt's pharmacy.  Nothing else was needed at time of call.

## 2016-08-29 NOTE — Assessment & Plan Note (Signed)
Strongly prefer in this setting: Bystolic, the most beta -1  selective Beta blocker available in sample form, with bisoprolol the most selective generic choice  on the market.   rec bisoprolol 5 mg daily to increase to bid if not controlled

## 2016-08-29 NOTE — Assessment & Plan Note (Signed)
Spirometry 08/28/2016  No airflow obst/ truncated effort dep portion - FENO 08/28/2016  =   7  - trial off labetalol 08/28/2016 >>>  Not entirely clear this is asthma related cough but reasonable to try on a more selective BB  In meantime, The most common causes of chronic cough in immunocompetent adults include the following: upper airway cough syndrome (UACS), previously referred to as postnasal drip syndrome (PNDS), which is caused by variety of rhinosinus conditions; (2) asthma; (3) GERD; (4) chronic bronchitis from cigarette smoking or other inhaled environmental irritants; (5) nonasthmatic eosinophilic bronchitis; and (6) bronchiectasis.   These conditions, singly or in combination, have accounted for up to 94% of the causes of chronic cough in prospective studies.   Other conditions have constituted no >6% of the causes in prospective studies These have included bronchogenic carcinoma, chronic interstitial pneumonia, sarcoidosis, left ventricular failure, ACEI-induced cough, and aspiration from a condition associated with pharyngeal dysfunction.    Chronic cough is often simultaneously caused by more than one condition. A single cause has been found from 38 to 82% of the time, multiple causes from 18 to 62%. Multiply caused cough has been the result of three diseases up to 42% of the time.  Since allergy eval by Willa RoughHicks neg and FENO so low this is not likely allergy or asthma relatd but note does report saba helped in past (while on labetatalol) so if stopping this BB doesn't work then should try proair 2 q 4 h prn and if not better rx with pred x 6 days then return if not resolved  - The proper method of use, as well as anticipated side effects, of a metered-dose inhaler are discussed and demonstrated to the patient.   Total time devoted to counseling  > 50 % of initial 60 min office visit:  review case with pt/ discussion of options/alternatives/ personally creating written customized  instructions  in presence of pt  then going over those specific  Instructions directly with the pt including how to use all of the meds but in particular covering each new medication in detail and the difference between the maintenance= "automatic" meds and the prns using an action plan format for the latter (If this problem/symptom => do that organization reading Left to right).  Please see AVS from this visit for a full list of these instructions which I personally wrote for this pt and  are unique to this visit.

## 2016-10-17 ENCOUNTER — Encounter: Payer: Self-pay | Admitting: Gynecology

## 2016-11-24 ENCOUNTER — Other Ambulatory Visit: Payer: Self-pay | Admitting: Gynecology

## 2017-06-02 ENCOUNTER — Other Ambulatory Visit: Payer: Self-pay | Admitting: Internal Medicine

## 2017-06-28 ENCOUNTER — Other Ambulatory Visit: Payer: Self-pay | Admitting: Internal Medicine

## 2017-07-16 ENCOUNTER — Encounter: Payer: Self-pay | Admitting: Internal Medicine

## 2017-07-16 ENCOUNTER — Ambulatory Visit (INDEPENDENT_AMBULATORY_CARE_PROVIDER_SITE_OTHER): Payer: 59 | Admitting: Internal Medicine

## 2017-07-16 VITALS — BP 124/76 | HR 99 | Ht 68.0 in | Wt 194.2 lb

## 2017-07-16 DIAGNOSIS — J45991 Cough variant asthma: Secondary | ICD-10-CM

## 2017-07-16 LAB — NITRIC OXIDE: Nitric Oxide: 16

## 2017-07-16 MED ORDER — PREDNISONE 10 MG PO TABS: ORAL_TABLET | ORAL | 0 refills | Status: DC

## 2017-07-16 NOTE — Progress Notes (Signed)
Subjective:     Patient ID: Kiara Kim, female   DOB: 10-Nov-1970,    MRN: [id number]    Brief patient profile:  46 yobf grew up in [place] came to [place] in 1992 for college and stayed and since then tendency for head colds to chest twice a year with allergy eval by Dr Willa Rough neg but rec ventolin prn seems to help a lot but keeps coming back twice a year so referred to pulmonary clinic 08/28/2016 by Dr   Talmage Nap     History of Present Illness  08/28/2016 1st [facility] office visit/ Kiara Kim  Recurrent cough on labetolol  Chief Complaint  Patient presents with  . Pulm Consult    Persistent cough. Non-productive cough. Feels a slight rattle in chest during coughing spells.    last episode was on Oct 2107 this was  worse started march 16th 2018 acutely> tried  just taking otcs cough meds no better  Worse when head hits pillow but present 24/7 rec Stop the normodyne and take bisoprolol 5 mg daily instead - if blood pressure too high can take twice daily  For cough  Or wheeze > proair 2 pffs every 4 hours as needed  Anytime you start coughing strongly rec: Protonix 40 mg Take 30-60 min before first meal of the day and Pepcid ac (famotidine) 20 mg one @  bedtime until cough is completely gone  GERD  Diet   Please remember to go to the  x-ray department downstairs in the basement  for your tests - we will call you with the results when they are available If not better > Prednisone 10 mg take  4 each am x 2 days,   2 each am x 2 days,  1 each am x 2 days and stop      07/16/2017  Acute exteneded ov/Kiara Kim re: recurrent cough  Chief Complaint  Patient presents with  . Acute Visit    c/o worsening nonprod cough X1 month.    pattern continues to where started head cold in early Jan 2019 and then gradually worse cough esp when head hits pillow but dry/ did not start gerd rx as rec and has no proair  PCP  Jul 01 2017 rx cough meds only Not limited by breathing from desired activities    No  obvious day to day or daytime variability or assoc excess/ purulent sputum or mucus plugs or hemoptysis or cp or chest tightness, subjective wheeze or overt   hb symptoms. No unusual exposure hx or h/o childhood pna/ asthma or knowledge of premature birth.   Also denies any obvious fluctuation of symptoms with weather or environmental changes or other aggravating or alleviating factors except as outlined above   Current Allergies, Complete Past Medical History, Past Surgical History, Family History, and Social History were reviewed in [facility] record.  ROS  The following are not active complaints unless bolded Hoarseness, sore throat, dysphagia, dental problems, itching, sneezing,  nasal congestion or discharge of excess mucus or purulent secretions, ear ache,   fever, chills, sweats, unintended wt loss or wt gain, classically pleuritic or exertional cp,  orthopnea pnd or leg swelling, presyncope, palpitations, abdominal pain, anorexia, nausea, vomiting, diarrhea  or change in bowel habits or change in bladder habits, change in stools or change in urine, dysuria, hematuria,  rash, arthralgias, visual complaints, headache, numbness, weakness or ataxia or problems with walking or coordination,  change in mood/affect or memory.  Current Meds  Medication Sig  . albuterol (PROVENTIL HFA;VENTOLIN HFA) 108 (90 Base) MCG/ACT inhaler Inhale 2 puffs into the lungs every 6 (six) hours as needed for wheezing or shortness of breath.  Marland Kitchen. amLODipine (NORVASC) 5 MG tablet Take 5 mg by mouth every morning.  . bisoprolol (ZEBETA) 5 MG tablet Take 1 tablet (5 mg total) by mouth daily.  . Insulin Human (INSULIN PUMP) 100 unit/ml SOLN Inject into the skin every 4 (four) hours. HUMOLOG--  TOTAL 68.9 UNITS PER DAY                        Q4 HR INCREMENTS FROM 12 AM TO 3PM ---  3.1                        4PM -- 2.4                           7PM -- 2.9  . metFORMIN (GLUCOPHAGE) 1000 MG tablet  Take 1,000 mg by mouth 2 (two) times daily with a meal.  . methyldopa (ALDOMET) 500 MG tablet Take 500 mg by mouth 2 (two) times daily.  .     .                  Objective:   Physical Exam   amb mildly hoarse bf nad    07/16/2017       194    08/28/16 185 lb 6.4 oz (84.1 kg)  07/13/15 217 lb (98.4 kg)  07/09/14 209 lb (94.8 kg)     Vital signs reviewed - Note on arrival 02 sats  98% on RA    HEENT: nl dentition, and oropharynx. Nl external ear canals without cough reflex - moderate bilateral non-specific turbinate edema    NECK :  without JVD/Nodes/TM/ nl carotid upstrokes bilaterally   LUNGS: no acc muscle use,  Nl contour chest which is clear to A and P bilaterally without cough on insp or exp maneuvers   CV:  RRR  no s3 or murmur or increase in P2, and no edema   ABD:  soft and nontender with nl inspiratory excursion in the supine position. No bruits or organomegaly appreciated, bowel sounds nl  MS:  Nl gait/ ext warm without deformities, calf tenderness, cyanosis or clubbing No obvious joint restrictions   SKIN: warm and dry without lesions    NEURO:  alert, approp, nl sensorium with  no motor or cerebellar deficits apparent.           Assessment:

## 2017-07-16 NOTE — Patient Instructions (Signed)
For cough > delsym 2 tsp every 12 hours as needed  For drainage / throat tickle try take CHLORPHENIRAMINE  4 mg - take one every 4 hours as needed - available over the counter- may cause drowsiness so start with just a bedtime dose or two and see how you tolerate it before trying in daytime    Prednisone 10 mg take  4 each am x 2 days,   2 each am x 2 days,  1 each am x 2 days and stop    Any time you start coughing for any reason:  Try prilosec otc 20mg   Take 30-60 min before first meal of the day and Pepcid ac (famotidine) 20 mg one @  bedtime until cough is completely gone for at least a week without the need for cough suppression     GERD (REFLUX)  is an extremely common cause of respiratory symptoms just like yours , many times with no obvious heartburn at all.    It can be treated with medication, but also with lifestyle changes including elevation of the head of your bed (ideally with 6 inch  bed blocks),  Smoking cessation, avoidance of late meals, excessive alcohol, and avoid fatty foods, chocolate, peppermint, colas, red wine, and acidic juices such as orange juice.  NO MINT OR MENTHOL PRODUCTS SO NO COUGH DROPS  USE SUGARLESS CANDY INSTEAD (Jolley ranchers or Stover's or Life Savers) or even ice chips will also do - the key is to swallow to prevent all throat clearing. NO OIL BASED VITAMINS - use powdered substitutes.    Pulmonary follow up is as needed

## 2017-07-21 ENCOUNTER — Encounter: Payer: Self-pay | Admitting: Internal Medicine

## 2017-07-21 NOTE — Assessment & Plan Note (Addendum)
Spirometry 08/28/2016  No airflow obst/ truncated effort dep portion - FENO 08/28/2016  =   7  - trial off labetalol 08/28/2016  ? improved - FENO 07/16/2017  =   16 during flare of cough      Acute flare in setting of uri s audible wheeze favors UACS over cough variant asthma  Upper airway cough syndrome (previously labeled PNDS),  is so named because it's frequently impossible to sort out how much is  CR/sinusitis with freq throat clearing (which can be related to primary GERD)   vs  causing  secondary (" extra esophageal")  GERD from wide swings in gastric pressure that occur with throat clearing, often  promoting self use of mint and menthol lozenges that reduce the lower esophageal sphincter tone and exacerbate the problem further in a cyclical fashion.   These are the same pts (now being labeled as having "irritable larynx syndrome" by some cough centers) who not infrequently have a history of having failed to tolerate ace inhibitors,  dry powder inhalers or biphosphonates or report having atypical/extraesophageal reflux symptoms that don't respond to standard doses of PPI  and are easily confused as having aecopd or asthma flares by even experienced allergists/ pulmonologists (myself included).    Of the three most common causes of  Sub-acute or recurrent or chronic cough, only one (GERD)  can actually contribute to/ trigger  the other two (asthma and post nasal drip syndrome)  and perpetuate the cylce of cough.  While not intuitively obvious, many patients with chronic low grade reflux do not cough until there is a primary insult that disturbs the protective epithelial barrier and exposes sensitive nerve endings.   This is typically viral but can be direct physical injury such as with an endotracheal tube.   The point is that once this occurs, it is difficult to eliminate the cycle  using anything but a maximally effective acid suppression regimen at least in the short run, accompanied by an  appropriate diet to address non acid GERD and control / eliminate the cough itself for at least 3 days.     rec pred x 6 days plus 1st gen H1 blockers per guidelines and anytime she is coughing: max rx for GERD   I had an extended discussion with the patient reviewing all relevant studies completed to date and  lasting 15 to 20 minutes of a 25 minute acute office visit  Re non-specific resp symptoms   Each maintenance medication was reviewed in detail including most importantly the difference between maintenance and prns and under what circumstances the prns are to be triggered using an action plan format that is not reflected in the computer generated alphabetically organized AVS.    Please see AVS for specific instructions unique to this visit that I personally wrote and verbalized to the the pt in detail and then reviewed with pt  by my nurse highlighting any  changes in therapy recommended at today's visit to their plan of care.

## 2017-07-22 ENCOUNTER — Other Ambulatory Visit: Payer: Self-pay | Admitting: Internal Medicine

## 2017-07-22 ENCOUNTER — Other Ambulatory Visit: Payer: Self-pay | Admitting: *Deleted

## 2017-07-22 DIAGNOSIS — J45991 Cough variant asthma: Secondary | ICD-10-CM

## 2017-07-22 MED ORDER — BISOPROLOL FUMARATE 5 MG PO TABS: 5.0000 mg | ORAL_TABLET | Freq: Every day | ORAL | 0 refills | Status: AC

## 2017-07-22 MED ORDER — BISOPROLOL FUMARATE 5 MG PO TABS: 5.0000 mg | ORAL_TABLET | Freq: Every day | ORAL | 0 refills | Status: DC

## 2017-12-11 ENCOUNTER — Other Ambulatory Visit: Payer: Self-pay | Admitting: Internal Medicine

## 2018-02-09 ENCOUNTER — Other Ambulatory Visit: Payer: Self-pay | Admitting: Internal Medicine

## 2018-02-09 DIAGNOSIS — J45991 Cough variant asthma: Secondary | ICD-10-CM

## 2018-05-06 ENCOUNTER — Other Ambulatory Visit: Payer: Self-pay | Admitting: Family Medicine

## 2018-05-06 DIAGNOSIS — Z1231 Encounter for screening mammogram for malignant neoplasm of breast: Secondary | ICD-10-CM

## 2018-06-17 ENCOUNTER — Ambulatory Visit
Admission: RE | Admit: 2018-06-17 | Discharge: 2018-06-17 | Disposition: A | Discharge status: Home / Self Care | Payer: 59 | Source: Ambulatory Visit | Attending: Family Medicine | Admitting: Family Medicine

## 2018-06-17 DIAGNOSIS — Z1231 Encounter for screening mammogram for malignant neoplasm of breast: Secondary | ICD-10-CM

## 2018-06-18 ENCOUNTER — Other Ambulatory Visit: Payer: Self-pay | Admitting: Family Medicine

## 2018-06-18 DIAGNOSIS — R928 Other abnormal and inconclusive findings on diagnostic imaging of breast: Secondary | ICD-10-CM

## 2018-06-25 ENCOUNTER — Ambulatory Visit
Admission: RE | Admit: 2018-06-25 | Discharge: 2018-06-25 | Disposition: A | Discharge status: Home / Self Care | Payer: 59 | Source: Ambulatory Visit | Attending: Family Medicine | Admitting: Family Medicine

## 2018-06-25 ENCOUNTER — Ambulatory Visit: Payer: 59

## 2018-06-25 DIAGNOSIS — R928 Other abnormal and inconclusive findings on diagnostic imaging of breast: Secondary | ICD-10-CM

## 2018-07-27 ENCOUNTER — Other Ambulatory Visit: Payer: Self-pay | Admitting: Internal Medicine

## 2019-07-30 ENCOUNTER — Other Ambulatory Visit: Payer: Self-pay

## 2019-07-30 ENCOUNTER — Ambulatory Visit
Admission: RE | Admit: 2019-07-30 | Discharge: 2019-07-30 | Disposition: A | Discharge status: Home / Self Care | Payer: 59 | Source: Ambulatory Visit | Attending: Family Medicine | Admitting: Family Medicine

## 2019-07-30 ENCOUNTER — Other Ambulatory Visit: Payer: Self-pay | Admitting: Family Medicine

## 2019-07-30 DIAGNOSIS — Z1231 Encounter for screening mammogram for malignant neoplasm of breast: Secondary | ICD-10-CM

## 2020-08-05 ENCOUNTER — Other Ambulatory Visit: Payer: Self-pay | Admitting: Family Medicine

## 2020-08-05 DIAGNOSIS — Z1231 Encounter for screening mammogram for malignant neoplasm of breast: Secondary | ICD-10-CM

## 2020-09-28 ENCOUNTER — Ambulatory Visit
Admission: RE | Admit: 2020-09-28 | Discharge: 2020-09-28 | Disposition: A | Discharge status: Home / Self Care | Payer: 59 | Source: Ambulatory Visit | Attending: Family Medicine | Admitting: Family Medicine

## 2020-09-28 ENCOUNTER — Other Ambulatory Visit: Payer: Self-pay | Admitting: Family Medicine

## 2020-09-28 ENCOUNTER — Other Ambulatory Visit: Payer: Self-pay

## 2020-09-28 DIAGNOSIS — Z1231 Encounter for screening mammogram for malignant neoplasm of breast: Secondary | ICD-10-CM

## 2021-03-09 NOTE — Progress Notes (Signed)
Synopsis: Referred for chronic cough by Dorisann Frames, MD  Subjective:   PATIENT ID: Kiara Kim GENDER: female DOB: 12-Mar-1971, MRN: [id number]  Chief Complaint  Patient presents with   Consult    Consult for dry cough for last year. Reports anytime she gets sick it stays and gets worse. States drinking water help temporarily. Has tried zyrtec but did not notice any relief. Reports she recently went to [place] and did not have a cough.    50yF with chronic cough followed previously by Dr. Sherene Sires, DM2, HTN. She has not seen ENT. She has not seen GI.  Off and on cough for many years. There is nothing that totally takes care of it. Triggers are URI. Sensation of something tickling her throat. Not productive cough. Cough has improved whenever she leaves [place] ([place], [place], [place]). Zyrtec was a little helpful with decreasing her night time cough. While she doesn't have PND per se she still does feel frequent tickling sensation as above. She has no overtly symptomatic reflux. Never has had diagnosis of asthma. No accompanying throat tightness or hoarseness.   Otherwise pertinent review of systems is negative.  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 She sews but tries to wear a mask when she does it - there is some dust that's kicked up when she does it. No pets. No hot tub. No water damage.   Past Medical History:  Diagnosis Date   Diabetes mellitus type 2, insulin dependent (HCC)    FOLLOWED BY DR Lurene Shadow   Hypertension    Infertility    Insulin pump in place    Lichen simplex chronicus    Polycystic ovarian disease    Uterine polyp    Wears contact lenses      Family History  Problem Relation Age of Onset   Diabetes Mother    Hypertension Mother    Cancer Mother        age 36   Breast cancer Mother    Diabetes Father    Hypertension Maternal Grandmother    Diabetes Paternal Grandmother    Diabetes Paternal Grandfather       Past Surgical History:  Procedure Laterality Date   CESAREAN SECTION  05-23-2005   DILATION AND CURETTAGE OF UTERUS  june 2015   dr. April Manson   HYSTEROSCOPY  2005   HYSTEROSCOPY WITH D & C N/A 07/01/2013   Procedure: DILATATION AND CURETTAGE /HYSTEROSCOPY WITH POLYPECTOMY;  Surgeon: Fermin Schwab, [place];  Location: [facility];  Service: Gynecology;  Laterality: N/A;   MYOMECTOMY ABDOMINAL APPROACH  2005    Social History   Socioeconomic History   Marital status: Married    Spouse name: Not on file   Number of children: Not on file   Years of education: Not on file   Highest education level: Not on file  Occupational History   Not on file  Tobacco Use   Smoking status: Never   Smokeless tobacco: Never  Substance and Sexual Activity   Alcohol use: No    Alcohol/week: 0.0 standard drinks   Drug use: No   Sexual activity: Yes    Partners: Male    Birth control/protection: None    Comment: 1st intercourse-  19, partners- greater than 5  Other Topics Concern   Not on file  Social History Narrative   Not on file   Social Determinants of Health  Financial Resource Strain: Not on file  Food Insecurity: Not on file  Transportation Needs: Not on file  Physical Activity: Not on file  Stress: Not on file  Social Connections: Not on file  Intimate Partner Violence: Not on file     No Known Allergies   Outpatient Medications Prior to Visit  Medication Sig Dispense Refill   amLODipine (NORVASC) 5 MG tablet Take 5 mg by mouth every morning.     atorvastatin (LIPITOR) 20 MG tablet Take 20 mg by mouth daily.     bisoprolol (ZEBETA) 5 MG tablet Take 1 tablet (5 mg total) by mouth daily. 90 tablet 0   Insulin Human (INSULIN PUMP) 100 unit/ml SOLN Inject into the skin every 4 (four) hours. HUMOLOG--  TOTAL 68.9 UNITS PER DAY                        Q4 HR INCREMENTS FROM 12 AM TO 3PM ---  3.1                        4PM -- 2.4                           7PM --  2.9     metFORMIN (GLUCOPHAGE) 1000 MG tablet Take 1,000 mg by mouth 2 (two) times daily with a meal.     albuterol (PROVENTIL HFA;VENTOLIN HFA) 108 (90 Base) MCG/ACT inhaler Inhale 2 puffs into the lungs every 6 (six) hours as needed for wheezing or shortness of breath. (Patient not taking: Reported on 03/10/2021) 1 Inhaler 6   methyldopa (ALDOMET) 500 MG tablet Take 500 mg by mouth 2 (two) times daily. (Patient not taking: Reported on 03/10/2021)     predniSONE (DELTASONE) 10 MG tablet Take  4 each am x 2 days,   2 each am x 2 days,  1 each am x 2 days and stop 14 tablet 0   No facility-administered medications prior to visit.       Objective:   Physical Exam:  General appearance: 50 y.o., female, NAD, conversant  Eyes: anicteric sclerae, moist conjunctivae; no lid-lag; PERRL, tracking appropriately HENT: NCAT; mildly hypertrophied left inferior turbinate, oropharynx, MMM, no mucosal ulcerations; normal hard and soft palate Neck: Trachea midline; no lymphadenopathy, no JVD Lungs: CTAB, no crackles, no wheeze, with normal respiratory effort CV: RRR, no MRGs  Abdomen: Soft, non-tender; non-distended, BS present  Extremities: No peripheral edema, radial and DP pulses present bilaterally  Skin: Normal temperature, turgor and texture; no rash Psych: Appropriate affect Neuro: Alert and oriented to person and place, no focal deficit    Vitals:   03/10/21 1607  BP: 126/82  Pulse: 80  Temp: 98.4 F (36.9 C)  TempSrc: Oral  SpO2: 100%  Weight: 214 lb (97.1 kg)  Height: 5\' 9"  (1.753 m)   100% on RA BMI Readings from Last 3 Encounters:  03/10/21 31.60 kg/m  07/16/17 29.53 kg/m  08/28/16 28.19 kg/m   Wt Readings from Last 3 Encounters:  03/10/21 214 lb (97.1 kg)  07/16/17 194 lb 3.2 oz (88.1 kg)  08/28/16 185 lb 6.4 oz (84.1 kg)     CBC    Component Value Date/Time   HGB 13.9 07/01/2013 1208   HCT 41.0 07/01/2013 1208      Chest Imaging: CXR 08/2016 reviewed by me,  clear lungs  Pulmonary Functions Testing Results: No flowsheet data found.  Spirometry 08/2016 reviewed by me and normal        Assessment & Plan:   # Chronic cough: Unclear culprit by history. If testing below is unrevealing then we discussed 8 week empiric trial of flonase vs pantoprazole.   Plan: - cbc/diff, IgE - PFTs - CXR PA/lateral     Omar Person, MD [facility] 03/10/2021 4:54 PM

## 2021-03-10 ENCOUNTER — Other Ambulatory Visit: Payer: Self-pay

## 2021-03-10 ENCOUNTER — Ambulatory Visit (INDEPENDENT_AMBULATORY_CARE_PROVIDER_SITE_OTHER): Payer: 59

## 2021-03-10 ENCOUNTER — Ambulatory Visit (INDEPENDENT_AMBULATORY_CARE_PROVIDER_SITE_OTHER): Payer: 59 | Admitting: Student

## 2021-03-10 ENCOUNTER — Encounter: Payer: Self-pay | Admitting: Student

## 2021-03-10 VITALS — BP 126/82 | HR 80 | Temp 98.4°F | Ht 69.0 in | Wt 214.0 lb

## 2021-03-10 DIAGNOSIS — R053 Chronic cough: Secondary | ICD-10-CM | POA: Diagnosis not present

## 2021-03-10 NOTE — Patient Instructions (Signed)
-   Labs today - X ray today - Breathing tests (PFTs) next week - If all unrevealing then we'll think about flonase to work on postnasal drainage/upper airway cough vs trial of pantoprazole for reflux

## 2021-03-13 LAB — CBC WITH DIFFERENTIAL/PLATELET
Absolute Monocytes: 486 cells/uL (ref 200–950)
Basophils Absolute: 30 cells/uL (ref 0–200)
Basophils Relative: 0.4 %
Eosinophils Absolute: 205 cells/uL (ref 15–500)
Eosinophils Relative: 2.7 %
HCT: 35.8 % (ref 35.0–45.0)
Hemoglobin: 11.3 g/dL — ABNORMAL LOW (ref 11.7–15.5)
Lymphs Abs: 3420 cells/uL (ref 850–3900)
MCH: 25.8 pg — ABNORMAL LOW (ref 27.0–33.0)
MCHC: 31.6 g/dL — ABNORMAL LOW (ref 32.0–36.0)
MCV: 81.7 fL (ref 80.0–100.0)
MPV: 11.3 fL (ref 7.5–12.5)
Monocytes Relative: 6.4 %
Neutro Abs: 3458 cells/uL (ref 1500–7800)
Neutrophils Relative %: 45.5 %
Platelets: 434 10*3/uL — ABNORMAL HIGH (ref 140–400)
RBC: 4.38 10*6/uL (ref 3.80–5.10)
RDW: 15.3 % — ABNORMAL HIGH (ref 11.0–15.0)
Total Lymphocyte: 45 %
WBC: 7.6 10*3/uL (ref 3.8–10.8)

## 2021-03-13 LAB — IGE: IgE (Immunoglobulin E), Serum: 246 kU/L — ABNORMAL HIGH (ref <=114)

## 2021-03-30 ENCOUNTER — Other Ambulatory Visit: Payer: Self-pay

## 2021-05-09 ENCOUNTER — Ambulatory Visit
Admission: RE | Admit: 2021-05-09 | Discharge: 2021-05-09 | Disposition: A | Discharge status: Home / Self Care | Payer: 59 | Source: Ambulatory Visit | Attending: Family Medicine | Admitting: Family Medicine

## 2021-05-09 ENCOUNTER — Other Ambulatory Visit: Payer: Self-pay | Admitting: Family Medicine

## 2021-05-09 DIAGNOSIS — M5459 Other low back pain: Secondary | ICD-10-CM

## 2021-05-09 DIAGNOSIS — M5410 Radiculopathy, site unspecified: Secondary | ICD-10-CM

## 2021-08-09 ENCOUNTER — Other Ambulatory Visit: Payer: Self-pay | Admitting: Family Medicine

## 2021-08-09 DIAGNOSIS — Z1231 Encounter for screening mammogram for malignant neoplasm of breast: Secondary | ICD-10-CM

## 2021-10-06 ENCOUNTER — Ambulatory Visit
Admission: RE | Admit: 2021-10-06 | Discharge: 2021-10-06 | Disposition: A | Discharge status: Home / Self Care | Payer: 59 | Source: Ambulatory Visit | Attending: Family Medicine | Admitting: Family Medicine

## 2021-10-06 DIAGNOSIS — Z1231 Encounter for screening mammogram for malignant neoplasm of breast: Secondary | ICD-10-CM

## 2021-12-20 NOTE — Progress Notes (Signed)
Synopsis: Referred for chronic cough by Leilani Able, MD  Subjective:   PATIENT ID: Kiara Kim GENDER: female DOB: 1970/12/05, MRN: [id number]  Chief Complaint  Patient presents with   Acute Visit    Dry cough x 3 wks. Cough occ wakes her in the night. She has tried zyrtec and many otc cough meds without relief.    50yF with chronic cough followed previously by Dr. Sherene Sires, DM2, HTN. She has not seen ENT. She has not seen GI.  Off and on cough for many years. There is nothing that totally takes care of it. Triggers are URI. Sensation of something tickling her throat. Not productive cough. Cough has improved whenever she leaves [place] ([place], [place], [place]). Zyrtec was a little helpful with decreasing her night time cough. While she doesn't have PND per se she still does feel frequent tickling sensation as above. She has no overtly symptomatic reflux. Never has had diagnosis of asthma. No accompanying throat tightness or hoarseness.  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 She sews but tries to wear a mask when she does it - there is some dust that's kicked up when she does it. No pets. No hot tub. No water damage.   Interval HPI:  Caught a head cold last week of June when she was up in [place]. [facility] has been out in her house since she got back. Never had much mucus production or anything. No sinus congestion, postnasal drainage, does have some reflux/heartburn. No fever.   Otherwise pertinent review of systems is negative.  Past Medical History:  Diagnosis Date   Diabetes mellitus type 2, insulin dependent (HCC)    FOLLOWED BY DR Lurene Shadow   Hypertension    Infertility    Insulin pump in place    Lichen simplex chronicus    Polycystic ovarian disease    Uterine polyp    Wears contact lenses      Family History  Problem Relation Age of Onset   Diabetes Mother    Hypertension Mother    Cancer Mother        age 62   Breast cancer Mother    Diabetes  Father    Hypertension Maternal Grandmother    Diabetes Paternal Grandmother    Diabetes Paternal Grandfather      Past Surgical History:  Procedure Laterality Date   CESAREAN SECTION  05-23-2005   DILATION AND CURETTAGE OF UTERUS  june 2015   dr. April Manson   HYSTEROSCOPY  2005   HYSTEROSCOPY WITH D & C N/A 07/01/2013   Procedure: DILATATION AND CURETTAGE /HYSTEROSCOPY WITH POLYPECTOMY;  Surgeon: Fermin Schwab, [place];  Location: [facility];  Service: Gynecology;  Laterality: N/A;   MYOMECTOMY ABDOMINAL APPROACH  2005    Social History   Socioeconomic History   Marital status: Married    Spouse name: Not on file   Number of children: Not on file   Years of education: Not on file   Highest education level: Not on file  Occupational History   Not on file  Tobacco Use   Smoking status: Never   Smokeless tobacco: Never  Substance and Sexual Activity   Alcohol use: No    Alcohol/week: 0.0 standard drinks of alcohol   Drug use: No   Sexual activity: Yes    Partners: Male    Birth control/protection: None    Comment: 1st intercourse-  19, partners- greater than  5  Other Topics Concern   Not on file  Social History Narrative   Not on file   Social Determinants of Health   Financial Resource Strain: Not on file  Food Insecurity: Not on file  Transportation Needs: Not on file  Physical Activity: Not on file  Stress: Not on file  Social Connections: Not on file  Intimate Partner Violence: Not on file     No Known Allergies   Outpatient Medications Prior to Visit  Medication Sig Dispense Refill   amLODipine (NORVASC) 5 MG tablet Take 5 mg by mouth every morning.     atorvastatin (LIPITOR) 20 MG tablet Take 20 mg by mouth daily.     bisoprolol (ZEBETA) 5 MG tablet Take 1 tablet (5 mg total) by mouth daily. 90 tablet 0   Insulin Human (INSULIN PUMP) 100 unit/ml SOLN Inject into the skin every 4 (four) hours. HUMOLOG--  TOTAL 68.9 UNITS PER DAY                         Q4 HR INCREMENTS FROM 12 AM TO 3PM ---  3.1                        4PM -- 2.4                           7PM -- 2.9     metFORMIN (GLUCOPHAGE) 1000 MG tablet Take 1,000 mg by mouth 2 (two) times daily with a meal.     No facility-administered medications prior to visit.       Objective:   Physical Exam:  General appearance: 51 y.o., female, NAD, conversant  Eyes: anicteric sclerae, moist conjunctivae; no lid-lag; PERRL, tracking appropriately HENT: NCAT; mildly hypertrophied left inferior turbinate, oropharynx, MMM, no mucosal ulcerations; normal hard and soft palate Neck: Trachea midline; no lymphadenopathy, no JVD Lungs: CTAB, no crackles, no wheeze, with normal respiratory effort CV: RRR, no MRGs  Abdomen: Soft, non-tender; non-distended, BS present  Extremities: No peripheral edema, radial and DP pulses present bilaterally  Skin: Normal temperature, turgor and texture; no rash Psych: Appropriate affect Neuro: Alert and oriented to person and place, no focal deficit    Vitals:   12/22/21 0911  BP: 132/76  Pulse: 89  Temp: 98.1 F (36.7 C)  TempSrc: Oral  SpO2: 96%  Weight: 198 lb 6.4 oz (90 kg)  Height: 5\' 9"  (1.753 m)    96% on RA BMI Readings from Last 3 Encounters:  12/22/21 29.30 kg/m  03/10/21 31.60 kg/m  07/16/17 29.53 kg/m   Wt Readings from Last 3 Encounters:  12/22/21 198 lb 6.4 oz (90 kg)  03/10/21 214 lb (97.1 kg)  07/16/17 194 lb 3.2 oz (88.1 kg)     CBC    Component Value Date/Time   WBC 7.6 03/10/2021 1647   RBC 4.38 03/10/2021 1647   HGB 11.3 (L) 03/10/2021 1647   HCT 35.8 03/10/2021 1647   PLT 434 (H) 03/10/2021 1647   MCV 81.7 03/10/2021 1647   MCH 25.8 (L) 03/10/2021 1647   MCHC 31.6 (L) 03/10/2021 1647   RDW 15.3 (H) 03/10/2021 1647   LYMPHSABS 3,420 03/10/2021 1647   EOSABS 205 03/10/2021 1647   BASOSABS 30 03/10/2021 1647   Eos 200 IgE 246    Chest Imaging: CXR 08/2016 reviewed by me, clear lungs  CXR  03/14/21 reviewed by me unremarkable  Pulmonary Functions Testing Results:     No data to display          Spirometry 08/2016 reviewed by me and normal        Assessment & Plan:   # Subacute on chronic cough: Sounds like we could work on reflux/LPR, throat clearing even if viral URI was trigger for recent subacute cough.   Plan: - CXR today - try protonix 40 mg 30 minutes before breakfast - try albuterol 1-2 puffs as needed to see if it makes a difference - discussed lifestyles measures for reflux and throat clearing - breathing tests next visit in 8 weeks. If you're feeling 100% better by then cancel breathing tests and just come to clinic appointment     Omar Person, MD [facility] 12/22/2021 9:36 AM

## 2021-12-22 ENCOUNTER — Ambulatory Visit (INDEPENDENT_AMBULATORY_CARE_PROVIDER_SITE_OTHER): Payer: 59

## 2021-12-22 ENCOUNTER — Encounter: Payer: Self-pay | Admitting: Student

## 2021-12-22 ENCOUNTER — Ambulatory Visit (INDEPENDENT_AMBULATORY_CARE_PROVIDER_SITE_OTHER): Payer: 59 | Admitting: Student

## 2021-12-22 VITALS — BP 132/76 | HR 89 | Temp 98.1°F | Ht 69.0 in | Wt 198.4 lb

## 2021-12-22 DIAGNOSIS — R053 Chronic cough: Secondary | ICD-10-CM | POA: Diagnosis not present

## 2021-12-22 MED ORDER — PANTOPRAZOLE SODIUM 40 MG PO TBEC: 40.0000 mg | DELAYED_RELEASE_TABLET | Freq: Every day | ORAL | 0 refills | Status: AC

## 2021-12-22 MED ORDER — ALBUTEROL SULFATE HFA 108 (90 BASE) MCG/ACT IN AERS: 2.0000 | INHALATION_SPRAY | Freq: Four times a day (QID) | RESPIRATORY_TRACT | 6 refills | Status: AC | PRN

## 2021-12-22 MED ORDER — BENZONATATE 200 MG PO CAPS: 200.0000 mg | ORAL_CAPSULE | Freq: Three times a day (TID) | ORAL | 1 refills | Status: AC | PRN

## 2021-12-22 NOTE — Patient Instructions (Addendum)
- CXR today - try protonix 40 mg 30 minutes before breakfast - try albuterol 1-2 puffs as needed to see if it makes a difference - see lifestyles measures below for reflux and throat clearing - breathing tests next visit in 8 weeks. If you're feeling 100% better by then cancel breathing tests and just come to clinic appointment   You may be a chronic throat clearer! You are not alone! The causes of chronic throat clearing include acid reflux (laryngopharyngeal reflux), allergies, environmental irritants such as tobacco smoke and air pollution, and asthma. If present for a long time throat clearing can become habit forming. When you clear your throat, you are transferring mucus from your throat up into your mouth and nose. We all secrete up to 2 liters (imagine a big Coke bottle) of mucus a day. This saliva is usually swallowed and ends up in the toilet eventually. By clearing the mucus back into your mouth and nose you are sending the saliva in the wrong direction. This is counterproductive. Unless you are walking around spitting all day (which most throat clearers do not do), the mucus will work its way back down to the throat and eventually be swallowed. Get the mucus going in the right direction. Swallow! Swallow! Swallow! No throat clearing.  Chronic throat clearing is damaging. The trauma from the throat clearing can cause redness and swelling of your vocal cords. If the clearing is very excessive small growths (granulomas) can form. These granulomas can get so large that they can eventually affect your breathing. Surgical removal may be necessary. The irritation and swelling produced by the clearing can cause saliva to sit in your throat. This causes more throat clearing. More throat clearing causes more stagnant mucus which causes more throat clearing, which causes more mucus, etc. A vicious cycle will ensue and the habit can be very difficult to break. Without your help and a conscious effort on  your part to break the cycle, the throat clearing will never stop.  Your doctor may prescribe medication and behavioral modifications to treat acid reflux disease. Nose and throat sprays may be prescribed to treat underlying allergies or asthma. Avoiding possible irritants will be recommended. Without changes to your behavior these treatments will not be successful. The following alterations are recommended:  Do not clear your throat. Swallow instead. This gets the mucus going in the right direction towards the toilet.  Carry around some water to assist with swallowing and mucus clearance. When you feel the urge to clear your throat take a sip of the water. If you absolutely need to clear your throat perform a non-traumatic throat clear. To do this pant with your mouth open and say "[place], [place], [place]" with a powerful but very breathy voice. This will clear the secretions without causing damage. Increase your water intake. This will thin secretions and make it easier to swallow. Comply with the behavior recommendations for reflux disease. Chew baking soda ([employer]) gum. This can be found on the internet or in the tooth paste isle of your pharmacy. Gum chewing can help with swallowing, reflux, and throat clearing. Chew three pieces a day. If you develop jaw discomfort or headaches decrease the amount of gum chewing. Tell your friends and family to tell you to swallow when you clear your throat. Some people have been clearing so long that they don't even know when they are doing it. Be patient. The urge to clear your throat will not go away overnight. It may  take 8 or 12 weeks for the medication and behavior modifications to work.  Gastroesophageal Reflux Disease, Adult Gastroesophageal reflux (GER) happens when acid from the stomach flows up into the tube that connects the mouth and the stomach (esophagus). Normally, food travels down the esophagus and stays in the stomach to be digested. However, when  a person has GER, food and stomach acid sometimes move back up into the esophagus. If this becomes a more serious problem, the person may be diagnosed with a disease called gastroesophageal reflux disease (GERD). GERD occurs when the reflux: Happens often. Causes frequent or severe symptoms. Causes problems such as damage to the esophagus. When stomach acid comes in contact with the esophagus, the acid may cause inflammation in the esophagus. Over time, GERD may create small holes (ulcers) in the lining of the esophagus. What are the causes? This condition is caused by a problem with the muscle between the esophagus and the stomach (lower esophageal sphincter, or LES). Normally, the LES muscle closes after food passes through the esophagus to the stomach. When the LES is weakened or abnormal, it does not close properly, and that allows food and stomach acid to go back up into the esophagus. The LES can be weakened by certain dietary substances, medicines, and medical conditions, including: Tobacco use. Pregnancy. Having a hiatal hernia. Alcohol use. Certain foods and beverages, such as coffee, chocolate, onions, and peppermint. What increases the risk? You are more likely to develop this condition if you: Have an increased body weight. Have a connective tissue disorder. Take NSAIDs, such as ibuprofen. What are the signs or symptoms? Symptoms of this condition include: Heartburn. Difficult or painful swallowing and the feeling of having a lump in the throat. A bitter taste in the mouth. Bad breath and having a large amount of saliva. Having an upset or bloated stomach and belching. Chest pain. Different conditions can cause chest pain. Make sure you see your health care provider if you experience chest pain. Shortness of breath or wheezing. Ongoing (chronic) cough or a nighttime cough. Wearing away of tooth enamel. Weight loss. How is this diagnosed? This condition may be diagnosed  based on a medical history and a physical exam. To determine if you have mild or severe GERD, your health care provider may also monitor how you respond to treatment. You may also have tests, including: A test to examine your stomach and esophagus with a small camera (endoscopy). A test that measures the acidity level in your esophagus. A test that measures how much pressure is on your esophagus. A barium swallow or modified barium swallow test to show the shape, size, and functioning of your esophagus. How is this treated? Treatment for this condition may vary depending on how severe your symptoms are. Your health care provider may recommend: Changes to your diet. Medicine. Surgery. The goal of treatment is to help relieve your symptoms and to prevent complications. Follow these instructions at home: Eating and drinking  Follow a diet as recommended by your health care provider. This may involve avoiding foods and drinks such as: Coffee and tea, with or without caffeine. Drinks that contain alcohol. Energy drinks and sports drinks. Carbonated drinks or sodas. Chocolate and cocoa. Peppermint and mint flavorings. Garlic and onions. Horseradish. Spicy and acidic foods, including peppers, chili powder, curry powder, vinegar, hot sauces, and barbecue sauce. Citrus fruit juices and citrus fruits, such as oranges, lemons, and limes. Tomato-based foods, such as red sauce, chili, salsa, and pizza with red  sauce. [place] and fatty foods, such as donuts, french fries, potato chips, and high-fat dressings. High-fat meats, such as hot dogs and fatty cuts of red and white meats, such as rib eye steak, sausage, ham, and bacon. High-fat dairy items, such as whole milk, butter, and cream cheese. Eat small, frequent meals instead of large meals. Avoid drinking large amounts of liquid with your meals. Avoid eating meals during the 2-3 hours before bedtime. Avoid lying down right after you eat. Do not  exercise right after you eat. Lifestyle  Do not use any products that contain nicotine or tobacco. These products include cigarettes, chewing tobacco, and vaping devices, such as e-cigarettes. If you need help quitting, ask your health care provider. Try to reduce your stress by using methods such as yoga or meditation. If you need help reducing stress, ask your health care provider. If you are overweight, reduce your weight to an amount that is healthy for you. Ask your health care provider for guidance about a safe weight loss goal. General instructions Pay attention to any changes in your symptoms. Take over-the-counter and prescription medicines only as told by your health care provider. Do not take aspirin, ibuprofen, or other NSAIDs unless your health care provider told you to take these medicines. Wear loose-fitting clothing. Do not wear anything tight around your waist that causes pressure on your abdomen. Raise (elevate) the head of your bed about 6 inches (15 cm). You can use a wedge to do this. Avoid bending over if this makes your symptoms worse. Keep all follow-up visits. This is important. Contact a health care provider if: You have: New symptoms. Unexplained weight loss. Difficulty swallowing or it hurts to swallow. Wheezing or a persistent cough. A hoarse voice. Your symptoms do not improve with treatment. Get help right away if: You have sudden pain in your arms, neck, jaw, teeth, or back. You suddenly feel sweaty, dizzy, or light-headed. You have chest pain or shortness of breath. You vomit and the vomit is green, yellow, or black, or it looks like blood or coffee grounds. You faint. You have stool that is red, bloody, or black. You cannot swallow, drink, or eat. These symptoms may represent a serious problem that is an emergency. Do not wait to see if the symptoms will go away. Get medical help right away. Call your local emergency services (911 in the [place] Do not  drive yourself to the hospital. Summary Gastroesophageal reflux happens when acid from the stomach flows up into the esophagus. GERD is a disease in which the reflux happens often, causes frequent or severe symptoms, or causes problems such as damage to the esophagus. Treatment for this condition may vary depending on how severe your symptoms are. Your health care provider may recommend diet and lifestyle changes, medicine, or surgery. Contact a health care provider if you have new or worsening symptoms. Take over-the-counter and prescription medicines only as told by your health care provider. Do not take aspirin, ibuprofen, or other NSAIDs unless your health care provider told you to do so. Keep all follow-up visits as told by your health care provider. This is important. This information is not intended to replace advice given to you by your health care provider. Make sure you discuss any questions you have with your health care provider. Document Revised: 11/30/2019 Document Reviewed: 11/30/2019 Elsevier Patient Education  2023 [employer].

## 2022-02-15 NOTE — Progress Notes (Unsigned)
Synopsis: Referred for chronic cough by Leilani Able, MD  Subjective:   PATIENT ID: Kiara Kim GENDER: female DOB: 06-24-1970, MRN: [id number]  No chief complaint on file.  51yF with chronic cough followed previously by Dr. Sherene Sires, DM2, HTN. She has not seen ENT. She has not seen GI.  Off and on cough for many years. There is nothing that totally takes care of it. Triggers are URI. Sensation of something tickling her throat. Not productive cough. Cough has improved whenever she leaves [place] ([place], [place], [place]). Zyrtec was a little helpful with decreasing her night time cough. While she doesn't have PND per se she still does feel frequent tickling sensation as above. She has no overtly symptomatic reflux. Never has had diagnosis of asthma. No accompanying throat tightness or hoarseness.  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 She sews but tries to wear a mask when she does it - there is some dust that's kicked up when she does it. No pets. No hot tub. No water damage.   Interval HPI:  Started on protonix last visit, PFTs   Otherwise pertinent review of systems is negative.  Past Medical History:  Diagnosis Date   Diabetes mellitus type 2, insulin dependent (HCC)    FOLLOWED BY DR Lurene Shadow   Hypertension    Infertility    Insulin pump in place    Lichen simplex chronicus    Polycystic ovarian disease    Uterine polyp    Wears contact lenses      Family History  Problem Relation Age of Onset   Diabetes Mother    Hypertension Mother    Cancer Mother        age 58   Breast cancer Mother    Diabetes Father    Hypertension Maternal Grandmother    Diabetes Paternal Grandmother    Diabetes Paternal Grandfather      Past Surgical History:  Procedure Laterality Date   CESAREAN SECTION  05-23-2005   DILATION AND CURETTAGE OF UTERUS  june 2015   dr. April Manson   HYSTEROSCOPY  2005   HYSTEROSCOPY WITH D & C N/A 07/01/2013   Procedure:  DILATATION AND CURETTAGE /HYSTEROSCOPY WITH POLYPECTOMY;  Surgeon: Fermin Schwab, MD;  Location: [facility];  Service: Gynecology;  Laterality: N/A;   MYOMECTOMY ABDOMINAL APPROACH  2005    Social History   Socioeconomic History   Marital status: Married    Spouse name: Not on file   Number of children: Not on file   Years of education: Not on file   Highest education level: Not on file  Occupational History   Not on file  Tobacco Use   Smoking status: Never   Smokeless tobacco: Never  Substance and Sexual Activity   Alcohol use: No    Alcohol/week: 0.0 standard drinks of alcohol   Drug use: No   Sexual activity: Yes    Partners: Male    Birth control/protection: None    Comment: 1st intercourse-  19, partners- greater than 5  Other Topics Concern   Not on file  Social History Narrative   Not on file   Social Determinants of Health   Financial Resource Strain: Not on file  Food Insecurity: Not on file  Transportation Needs: Not on file  Physical Activity: Not on file  Stress: Not on file  Social Connections: Not on file  Intimate Partner Violence: Not on file  No Known Allergies   Outpatient Medications Prior to Visit  Medication Sig Dispense Refill   albuterol (VENTOLIN HFA) 108 (90 Base) MCG/ACT inhaler Inhale 2 puffs into the lungs every 6 (six) hours as needed for wheezing or shortness of breath. 8 g 6   amLODipine (NORVASC) 5 MG tablet Take 5 mg by mouth every morning.     atorvastatin (LIPITOR) 20 MG tablet Take 20 mg by mouth daily.     benzonatate (TESSALON) 200 MG capsule Take 1 capsule (200 mg total) by mouth 3 (three) times daily as needed for cough. 30 capsule 1   bisoprolol (ZEBETA) 5 MG tablet Take 1 tablet (5 mg total) by mouth daily. 90 tablet 0   Insulin Human (INSULIN PUMP) 100 unit/ml SOLN Inject into the skin every 4 (four) hours. HUMOLOG--  TOTAL 68.9 UNITS PER DAY                        Q4 HR INCREMENTS FROM 12 AM TO 3PM  ---  3.1                        4PM -- 2.4                           7PM -- 2.9     metFORMIN (GLUCOPHAGE) 1000 MG tablet Take 1,000 mg by mouth 2 (two) times daily with a meal.     pantoprazole (PROTONIX) 40 MG tablet Take 1 tablet (40 mg total) by mouth daily. 90 tablet 0   No facility-administered medications prior to visit.       Objective:   Physical Exam:  General appearance: 51 y.o., female, NAD, conversant  Eyes: anicteric sclerae, moist conjunctivae; no lid-lag; PERRL, tracking appropriately HENT: NCAT; mildly hypertrophied left inferior turbinate, oropharynx, MMM, no mucosal ulcerations; normal hard and soft palate Neck: Trachea midline; no lymphadenopathy, no JVD Lungs: CTAB, no crackles, no wheeze, with normal respiratory effort CV: RRR, no MRGs  Abdomen: Soft, non-tender; non-distended, BS present  Extremities: No peripheral edema, radial and DP pulses present bilaterally  Skin: Normal temperature, turgor and texture; no rash Psych: Appropriate affect Neuro: Alert and oriented to person and place, no focal deficit    There were no vitals filed for this visit.     on RA BMI Readings from Last 3 Encounters:  12/22/21 29.30 kg/m  03/10/21 31.60 kg/m  07/16/17 29.53 kg/m   Wt Readings from Last 3 Encounters:  12/22/21 198 lb 6.4 oz (90 kg)  03/10/21 214 lb (97.1 kg)  07/16/17 194 lb 3.2 oz (88.1 kg)     CBC    Component Value Date/Time   WBC 7.6 03/10/2021 1647   RBC 4.38 03/10/2021 1647   HGB 11.3 (L) 03/10/2021 1647   HCT 35.8 03/10/2021 1647   PLT 434 (H) 03/10/2021 1647   MCV 81.7 03/10/2021 1647   MCH 25.8 (L) 03/10/2021 1647   MCHC 31.6 (L) 03/10/2021 1647   RDW 15.3 (H) 03/10/2021 1647   LYMPHSABS 3,420 03/10/2021 1647   EOSABS 205 03/10/2021 1647   BASOSABS 30 03/10/2021 1647   Eos 200 IgE 246    Chest Imaging: CXR 08/2016 reviewed by me, clear lungs  CXR 03/14/21 reviewed by me unremarkable  CXR 12/22/21 reviewed by me  normal  Pulmonary Functions Testing Results:     No data to display  Spirometry 08/2016 reviewed by me and normal        Assessment & Plan:   # Subacute on chronic cough: Sounds like we could work on reflux/LPR, throat clearing even if viral URI was trigger for recent subacute cough.   Plan: - CXR today - try protonix 40 mg 30 minutes before breakfast - try albuterol 1-2 puffs as needed to see if it makes a difference - discussed lifestyles measures for reflux and throat clearing - breathing tests next visit in 8 weeks. If you're feeling 100% better by then cancel breathing tests and just come to clinic appointment     Omar Person, MD [facility] 02/15/2022 5:51 PM

## 2022-02-16 ENCOUNTER — Encounter: Payer: Self-pay | Admitting: Student

## 2022-02-16 ENCOUNTER — Ambulatory Visit (INDEPENDENT_AMBULATORY_CARE_PROVIDER_SITE_OTHER): Payer: 59 | Admitting: Student

## 2022-02-16 VITALS — BP 132/86 | HR 92 | Ht 68.5 in | Wt 207.8 lb

## 2022-02-16 DIAGNOSIS — R053 Chronic cough: Secondary | ICD-10-CM

## 2022-02-16 DIAGNOSIS — R942 Abnormal results of pulmonary function studies: Secondary | ICD-10-CM

## 2022-02-16 LAB — CBC WITH DIFFERENTIAL/PLATELET
Basophils Absolute: 0 10*3/uL (ref 0.0–0.1)
Basophils Relative: 0.3 % (ref 0.0–3.0)
Eosinophils Absolute: 0.1 10*3/uL (ref 0.0–0.7)
Eosinophils Relative: 1.5 % (ref 0.0–5.0)
HCT: 33.3 % — ABNORMAL LOW (ref 36.0–46.0)
Hemoglobin: 10.8 g/dL — ABNORMAL LOW (ref 12.0–15.0)
Lymphocytes Relative: 17.7 % (ref 12.0–46.0)
Lymphs Abs: 1.5 10*3/uL (ref 0.7–4.0)
MCHC: 32.3 g/dL (ref 30.0–36.0)
MCV: 83.1 fl (ref 78.0–100.0)
Monocytes Absolute: 0.6 10*3/uL (ref 0.1–1.0)
Monocytes Relative: 7.1 % (ref 3.0–12.0)
Neutro Abs: 6 10*3/uL (ref 1.4–7.7)
Neutrophils Relative %: 73.4 % (ref 43.0–77.0)
Platelets: 400 10*3/uL (ref 150.0–400.0)
RBC: 4.01 Mil/uL (ref 3.87–5.11)
RDW: 15.9 % — ABNORMAL HIGH (ref 11.5–15.5)
WBC: 8.2 10*3/uL (ref 4.0–10.5)

## 2022-02-16 LAB — PULMONARY FUNCTION TEST
DL/VA % pred: 87 %
DL/VA: 3.68 ml/min/mmHg/L
DLCO cor % pred: 66 %
DLCO cor: 16.33 ml/min/mmHg
DLCO unc % pred: 66 %
DLCO unc: 16.33 ml/min/mmHg
FEF 25-75 Post: 3.69 L/sec
FEF 25-75 Pre: 3.5 L/sec
FEF2575-%Change-Post: 5 %
FEF2575-%Pred-Post: 120 %
FEF2575-%Pred-Pre: 114 %
FEV1-%Change-Post: 2 %
FEV1-%Pred-Post: 82 %
FEV1-%Pred-Pre: 80 %
FEV1-Post: 2.69 L
FEV1-Pre: 2.62 L
FEV1FVC-%Change-Post: 3 %
FEV1FVC-%Pred-Pre: 107 %
FEV6-%Change-Post: 0 %
FEV6-%Pred-Post: 75 %
FEV6-%Pred-Pre: 75 %
FEV6-Post: 3.02 L
FEV6-Pre: 3.04 L
FEV6FVC-%Pred-Post: 102 %
FEV6FVC-%Pred-Pre: 102 %
FVC-%Change-Post: 0 %
FVC-%Pred-Post: 73 %
FVC-%Pred-Pre: 73 %
FVC-Post: 3.02 L
FVC-Pre: 3.04 L
Post FEV1/FVC ratio: 89 %
Post FEV6/FVC ratio: 100 %
Pre FEV1/FVC ratio: 86 %
Pre FEV6/FVC Ratio: 100 %
RV % pred: 76 %
RV: 1.51 L
TLC % pred: 83 %
TLC: 4.78 L

## 2022-02-16 NOTE — Progress Notes (Signed)
PFT done today. 

## 2022-02-16 NOTE — Patient Instructions (Signed)
- Can try coming off of protonix. See lifestyle measures below to limit reflux. If these aren't enough then can try as needed pepcid over the counter but if that isn't enough to take care of reflux and cough, would resume protonix and schedule appointment with GI.  - lab today, I'll be in touch to let you know if we need to consider CT Chest or echocardiogram to look for early/mild lung scarring or pulmonary hypertension due to low diffusing capacity (which is only mildly low anyway).   Gastroesophageal Reflux Disease, Adult Gastroesophageal reflux (GER) happens when acid from the stomach flows up into the tube that connects the mouth and the stomach (esophagus). Normally, food travels down the esophagus and stays in the stomach to be digested. However, when a person has GER, food and stomach acid sometimes move back up into the esophagus. If this becomes a more serious problem, the person may be diagnosed with a disease called gastroesophageal reflux disease (GERD). GERD occurs when the reflux: Happens often. Causes frequent or severe symptoms. Causes problems such as damage to the esophagus. When stomach acid comes in contact with the esophagus, the acid may cause inflammation in the esophagus. Over time, GERD may create small holes (ulcers) in the lining of the esophagus. What are the causes? This condition is caused by a problem with the muscle between the esophagus and the stomach (lower esophageal sphincter, or LES). Normally, the LES muscle closes after food passes through the esophagus to the stomach. When the LES is weakened or abnormal, it does not close properly, and that allows food and stomach acid to go back up into the esophagus. The LES can be weakened by certain dietary substances, medicines, and medical conditions, including: Tobacco use. Pregnancy. Having a hiatal hernia. Alcohol use. Certain foods and beverages, such as coffee, chocolate, onions, and peppermint. What increases  the risk? You are more likely to develop this condition if you: Have an increased body weight. Have a connective tissue disorder. Take NSAIDs, such as ibuprofen. What are the signs or symptoms? Symptoms of this condition include: Heartburn. Difficult or painful swallowing and the feeling of having a lump in the throat. A bitter taste in the mouth. Bad breath and having a large amount of saliva. Having an upset or bloated stomach and belching. Chest pain. Different conditions can cause chest pain. Make sure you see your health care provider if you experience chest pain. Shortness of breath or wheezing. Ongoing (chronic) cough or a nighttime cough. Wearing away of tooth enamel. Weight loss. How is this diagnosed? This condition may be diagnosed based on a medical history and a physical exam. To determine if you have mild or severe GERD, your health care provider may also monitor how you respond to treatment. You may also have tests, including: A test to examine your stomach and esophagus with a small camera (endoscopy). A test that measures the acidity level in your esophagus. A test that measures how much pressure is on your esophagus. A barium swallow or modified barium swallow test to show the shape, size, and functioning of your esophagus. How is this treated? Treatment for this condition may vary depending on how severe your symptoms are. Your health care provider may recommend: Changes to your diet. Medicine. Surgery. The goal of treatment is to help relieve your symptoms and to prevent complications. Follow these instructions at home: Eating and drinking  Follow a diet as recommended by your health care provider. This may involve avoiding foods  and drinks such as: Coffee and tea, with or without caffeine. Drinks that contain alcohol. Energy drinks and sports drinks. Carbonated drinks or sodas. Chocolate and cocoa. Peppermint and mint flavorings. Garlic and  onions. Horseradish. Spicy and acidic foods, including peppers, chili powder, curry powder, vinegar, hot sauces, and barbecue sauce. Citrus fruit juices and citrus fruits, such as oranges, lemons, and limes. Tomato-based foods, such as red sauce, chili, salsa, and pizza with red sauce. Fried and fatty foods, such as donuts, french fries, potato chips, and high-fat dressings. High-fat meats, such as hot dogs and fatty cuts of red and white meats, such as rib eye steak, sausage, ham, and bacon. High-fat dairy items, such as whole milk, butter, and cream cheese. Eat small, frequent meals instead of large meals. Avoid drinking large amounts of liquid with your meals. Avoid eating meals during the 2-3 hours before bedtime. Avoid lying down right after you eat. Do not exercise right after you eat. Lifestyle  Do not use any products that contain nicotine or tobacco. These products include cigarettes, chewing tobacco, and vaping devices, such as e-cigarettes. If you need help quitting, ask your health care provider. Try to reduce your stress by using methods such as yoga or meditation. If you need help reducing stress, ask your health care provider. If you are overweight, reduce your weight to an amount that is healthy for you. Ask your health care provider for guidance about a safe weight loss goal. General instructions Pay attention to any changes in your symptoms. Take over-the-counter and prescription medicines only as told by your health care provider. Do not take aspirin, ibuprofen, or other NSAIDs unless your health care provider told you to take these medicines. Wear loose-fitting clothing. Do not wear anything tight around your waist that causes pressure on your abdomen. Raise (elevate) the head of your bed about 6 inches (15 cm). You can use a wedge to do this. Avoid bending over if this makes your symptoms worse. Keep all follow-up visits. This is important. Contact a health care provider  if: You have: New symptoms. Unexplained weight loss. Difficulty swallowing or it hurts to swallow. Wheezing or a persistent cough. A hoarse voice. Your symptoms do not improve with treatment. Get help right away if: You have sudden pain in your arms, neck, jaw, teeth, or back. You suddenly feel sweaty, dizzy, or light-headed. You have chest pain or shortness of breath. You vomit and the vomit is green, yellow, or black, or it looks like blood or coffee grounds. You faint. You have stool that is red, bloody, or black. You cannot swallow, drink, or eat. These symptoms may represent a serious problem that is an emergency. Do not wait to see if the symptoms will go away. Get medical help right away. Call your local emergency services (911 in the [place] Do not drive yourself to the hospital. Summary Gastroesophageal reflux happens when acid from the stomach flows up into the esophagus. GERD is a disease in which the reflux happens often, causes frequent or severe symptoms, or causes problems such as damage to the esophagus. Treatment for this condition may vary depending on how severe your symptoms are. Your health care provider may recommend diet and lifestyle changes, medicine, or surgery. Contact a health care provider if you have new or worsening symptoms. Take over-the-counter and prescription medicines only as told by your health care provider. Do not take aspirin, ibuprofen, or other NSAIDs unless your health care provider told you to do so. Keep  all follow-up visits as told by your health care provider. This is important. This information is not intended to replace advice given to you by your health care provider. Make sure you discuss any questions you have with your health care provider. Document Revised: 11/30/2019 Document Reviewed: 11/30/2019 Elsevier Patient Education  2023 [employer].

## 2022-02-19 ENCOUNTER — Encounter: Payer: Self-pay | Admitting: Student

## 2022-09-26 ENCOUNTER — Other Ambulatory Visit: Payer: Self-pay | Admitting: Family Medicine

## 2022-09-26 DIAGNOSIS — Z1231 Encounter for screening mammogram for malignant neoplasm of breast: Secondary | ICD-10-CM

## 2022-10-09 ENCOUNTER — Ambulatory Visit
Admission: RE | Admit: 2022-10-09 | Discharge: 2022-10-09 | Disposition: A | Discharge status: Home / Self Care | Payer: 59 | Source: Ambulatory Visit | Attending: Family Medicine | Admitting: Family Medicine

## 2022-10-09 DIAGNOSIS — Z1231 Encounter for screening mammogram for malignant neoplasm of breast: Secondary | ICD-10-CM

## 2023-03-19 ENCOUNTER — Ambulatory Visit: Payer: 59 | Admitting: Podiatry

## 2023-04-10 ENCOUNTER — Ambulatory Visit: Payer: 59 | Admitting: Podiatry

## 2023-07-17 ENCOUNTER — Encounter: Payer: Self-pay | Admitting: Podiatry

## 2023-07-17 ENCOUNTER — Ambulatory Visit (INDEPENDENT_AMBULATORY_CARE_PROVIDER_SITE_OTHER): Payer: 59 | Admitting: Podiatry

## 2023-07-17 VITALS — Ht 69.0 in | Wt 207.0 lb

## 2023-07-17 DIAGNOSIS — L84 Corns and callosities: Secondary | ICD-10-CM | POA: Diagnosis not present

## 2023-07-17 DIAGNOSIS — Z0189 Encounter for other specified special examinations: Secondary | ICD-10-CM

## 2023-07-17 DIAGNOSIS — B353 Tinea pedis: Secondary | ICD-10-CM

## 2023-07-17 DIAGNOSIS — E119 Type 2 diabetes mellitus without complications: Secondary | ICD-10-CM

## 2023-07-17 DIAGNOSIS — M79675 Pain in left toe(s): Secondary | ICD-10-CM

## 2023-07-17 DIAGNOSIS — Z794 Long term (current) use of insulin: Secondary | ICD-10-CM

## 2023-07-17 DIAGNOSIS — M79674 Pain in right toe(s): Secondary | ICD-10-CM

## 2023-07-17 DIAGNOSIS — M2041 Other hammer toe(s) (acquired), right foot: Secondary | ICD-10-CM

## 2023-07-17 DIAGNOSIS — M2042 Other hammer toe(s) (acquired), left foot: Secondary | ICD-10-CM

## 2023-07-17 DIAGNOSIS — B351 Tinea unguium: Secondary | ICD-10-CM | POA: Diagnosis not present

## 2023-07-17 MED ORDER — KETOCONAZOLE 2 % EX CREA: TOPICAL_CREAM | CUTANEOUS | 0 refills | Status: AC

## 2023-07-17 NOTE — Progress Notes (Signed)
Subjective: Chief Complaint  Patient presents with   Kiara Kim    She is here to establish for nail trim, she is diabetic and last A1C was 7.0, and PCP is dr. Pecola Leisure and last seen 3-4 months ago,    Kiara Kim presents today for diabetic foot evaluation.  Patient relates h/o diabetes.  Patient denies any h/o foot wounds.  PCP is Leilani Able, MD.  Past Medical History:  Diagnosis Date   Diabetes mellitus type 2, insulin dependent (HCC)    FOLLOWED BY DR Lurene Shadow   Hypertension    Infertility    Insulin pump in place    Lichen simplex chronicus    Polycystic ovarian disease    Uterine polyp    Wears contact lenses     Patient Active Problem List   Diagnosis Date Noted   Cough variant asthma 08/28/2016   Vulvar lesion 11/03/2015   Diabetes mellitus    Polycystic ovarian disease    Essential hypertension, benign 04/07/2008    Past Surgical History:  Procedure Laterality Date   CESAREAN SECTION  05-23-2005   DILATION AND CURETTAGE OF UTERUS  june 2015   dr. April Manson   HYSTEROSCOPY  2005   HYSTEROSCOPY WITH D & C N/A 07/01/2013   Procedure: DILATATION AND CURETTAGE /HYSTEROSCOPY WITH POLYPECTOMY;  Surgeon: Fermin Schwab, MD;  Location: [facility];  Service: Gynecology;  Laterality: N/A;   MYOMECTOMY ABDOMINAL APPROACH  2005    Current Outpatient Medications on File Prior to Visit  Medication Sig Dispense Refill   albuterol (VENTOLIN HFA) 108 (90 Base) MCG/ACT inhaler Inhale 2 puffs into the lungs every 6 (six) hours as needed for wheezing or shortness of breath. 8 g 6   amLODipine (NORVASC) 5 MG tablet Take 5 mg by mouth every morning.     atorvastatin (LIPITOR) 20 MG tablet Take 20 mg by mouth daily.     benzonatate (TESSALON) 200 MG capsule Take 1 capsule (200 mg total) by mouth 3 (three) times daily as needed for cough. 30 capsule 1   bisoprolol (ZEBETA) 5 MG tablet Take 1 tablet (5 mg total) by mouth daily. 90 tablet 0   Insulin Human (INSULIN  PUMP) 100 unit/ml SOLN Inject into the skin every 4 (four) hours. HUMOLOG--  TOTAL 68.9 UNITS PER DAY                        Q4 HR INCREMENTS FROM 12 AM TO 3PM ---  3.1                        4PM -- 2.4                           7PM -- 2.9     metFORMIN (GLUCOPHAGE) 1000 MG tablet Take 1,000 mg by mouth 2 (two) times daily with a meal.     pantoprazole (PROTONIX) 40 MG tablet Take 1 tablet (40 mg total) by mouth daily. 90 tablet 0   No current facility-administered medications on file prior to visit.     No Known Allergies  Social History   Occupational History   Not on file  Tobacco Use   Smoking status: Never   Smokeless tobacco: Never  Substance and Sexual Activity   Alcohol use: No    Alcohol/week: 0.0 standard drinks of alcohol   Drug use: No   Sexual activity: Yes  Partners: Male    Birth control/protection: None    Comment: 1st intercourse-  19, partners- greater than 5    Family History  Problem Relation Age of Onset   Diabetes Mother    Hypertension Mother    Cancer Mother        age 69   Breast cancer Mother    Diabetes Father    Hypertension Maternal Grandmother    Diabetes Paternal Grandmother    Diabetes Paternal Grandfather     Immunization History  Administered Date(s) Administered   Influenza Split 03/15/2017   Influenza, Quadrivalent, Recombinant, Inj, Pf 03/18/2017, 02/10/2018, 03/06/2019, 03/11/2020, 03/03/2021   Influenza,inj,Quad PF,6+ Mos 05/19/2012   Tdap 06/27/2012    Objective: There were no vitals filed for this visit.  Kiara Kim is a pleasant 53 y.o. female in NAD. AAO X 3.   Title   Diabetic Foot Exam - detailed Date & Time: 07/17/2023 10:30 AM Diabetic Foot exam was performed with the following findings: Yes  Visual Foot Exam completed.: Yes  Is there a history of foot ulcer?: No Is there a foot ulcer now?: No Is there swelling?: No Is there elevated skin temperature?: No Is there abnormal foot shape?: Yes Is there  a claw toe deformity?: No Are the toenails long?: Yes Are the toenails thick?: Yes Are the toenails ingrown?: No Is the skin thin, fragile, shiny and hairless?": No Normal Range of Motion?: Yes Is there foot or ankle muscle weakness?: No Do you have pain in calf while walking?: No Are the shoes appropriate in style and fit?: Yes Can the patient see the bottom of their feet?: Yes Pulse Foot Exam completed.: Yes   Right Posterior Tibialis: Present Left posterior Tibialis: Present   Right Dorsalis Pedis: Present Left Dorsalis Pedis: Present     Sensory Foot Exam Completed.: Yes Semmes-Weinstein Monofilament Test "+" means "has sensation" and "-" means "no sensation"  R Foot Test Control: Pos L Foot Test Control: Pos   R Site 1-Great Toe: Pos L Site 1-Great Toe: Pos   R Site 4: Pos L Site 4: Pos   R site 5: Pos L Site 5: Pos  R Site 6: Pos L Site 6: Pos     Image components are not supported.   Image components are not supported. Image components are not supported.  Tuning Fork Right vibratory: present Left vibratory: present  Comments Toenails bilateral great toes and 2-5 right foot elongated, discolored, dystrophic, thickened, and crumbly with subungual debris and tenderness to dorsal palpation. Hyperkeratotic lesion(s) submet head 2 left foot, submet head 3 right foot, submet head 4 left foot, and submet head 5 b/l.  No erythema, no edema, no drainage, no fluctuance.  Diffuse scaling noted peripherally and plantarly b/l feet.  No interdigital macerations.  No blisters, no weeping. No signs of secondary bacterial infection noted.      Assessment: 1. Pain due to onychomycosis of toenails of both feet   2. Callus   3. Tinea pedis of both feet   4. Acquired hammertoes of both feet   5. Type 2 diabetes mellitus without complication, with long-term current use of insulin (HCC)   6. Encounter for diabetic foot exam (HCC)      ADA Risk Categorization: Low Risk:  Patient has  all of the following: Intact protective sensation No prior foot ulcer  No severe deformity Pedal pulses present  Plan: Meds ordered this encounter  Medications   ketoconazole (NIZORAL) 2 % cream  Sig: Apply to both feet and between toes once daily for 6 weeks.    Dispense:  60 g    Refill:  0   Diabetic foot examination performed today. All patient's and/or POA's questions/concerns addressed on today's visit. Toenails 1-5 right and left great toe debrided in length and girth without incident. Callus(es) submet head 2 left foot, submet head 3 right foot, submet head 4 left foot, and submet head 5 b/l pared with sharp debridement without incident. Discussed treatment for onychomycosis. She is interested in oral medications for onychomycosis. Will have her follow up with Dr. Lilian Kapur to start medication. -Rx refilled for Ketoconazole Cream 2% to be applied once daily for six weeks. Continue daily foot inspections and monitor blood glucose per PCP/Endocrinologist's recommendations.Continue soft, supportive shoe gear daily. Report any pedal injuries to medical professional. Call office if there are any questions/concerns. -Patient/POA to call should there be question/concern in the interim.  No follow-ups on file.  Kiara Kim, DPM      [facility] LOCATION: [address], [place] [address]                   Office [phone]   [facility] LOCATION: [address] [place], [place] [address] Office [phone]

## 2023-08-13 ENCOUNTER — Ambulatory Visit (INDEPENDENT_AMBULATORY_CARE_PROVIDER_SITE_OTHER): Payer: 59 | Admitting: Podiatry

## 2023-08-13 ENCOUNTER — Encounter: Payer: Self-pay | Admitting: Podiatry

## 2023-08-13 DIAGNOSIS — Z79899 Other long term (current) drug therapy: Secondary | ICD-10-CM

## 2023-08-13 DIAGNOSIS — B351 Tinea unguium: Secondary | ICD-10-CM | POA: Diagnosis not present

## 2023-08-13 MED ORDER — TERBINAFINE HCL 250 MG PO TABS: 250.0000 mg | ORAL_TABLET | Freq: Every day | ORAL | 0 refills | Status: DC

## 2023-08-13 NOTE — Progress Notes (Signed)
  Subjective:  Patient ID: Kiara Kim, female    DOB: December 15, 1970,  MRN: [id number]  Chief Complaint  Patient presents with   Follow-up    Patient is here to discuss with doctor about orial medication for the darkness of her toe nails    53 y.o. female presents with the above complaint. History confirmed with patient.  Has been going on for several months worsening on the big toes the fifth toe was dark before  Objective:  Physical Exam: warm, good capillary refill, no trophic changes or ulcerative lesions, normal DP and PT pulses, normal sensory exam, and onychomycosis of bilateral hallux nail fifth toe on the right shows melanonychia.      Assessment:   1. Onychomycosis   2. High risk medication use      Plan:  Patient was evaluated and treated and all questions answered.  Onychomycosis -Educated on etiology of nail fungus. -Discussed oral topical and laser therapy and risks and benefits of each -eRx for oral terbinafine #90. Educated on risks and benefits of the medication. -Photographs taken -Order for LFTs given and she will complete this at [facility]   Return in about 3 months (around 11/13/2023) for follow up after nail fungus treatment.

## 2023-08-17 LAB — HEPATIC FUNCTION PANEL
ALT: 12 IU/L (ref 0–32)
AST: 15 IU/L (ref 0–40)
Albumin: 3.9 g/dL (ref 3.9–4.9)
Alkaline Phosphatase: 87 IU/L (ref 44–121)
Bilirubin Total: <0.2 mg/dL (ref 0.0–1.2)
Bilirubin, Direct: 0.07 mg/dL (ref 0.00–0.40)
Total Protein: 6.5 g/dL (ref 6.0–8.5)

## 2023-09-18 ENCOUNTER — Other Ambulatory Visit: Payer: Self-pay | Admitting: Family Medicine

## 2023-09-18 DIAGNOSIS — Z1231 Encounter for screening mammogram for malignant neoplasm of breast: Secondary | ICD-10-CM

## 2023-10-11 ENCOUNTER — Ambulatory Visit
Admission: RE | Admit: 2023-10-11 | Discharge: 2023-10-11 | Disposition: A | Discharge status: Home / Self Care | Source: Ambulatory Visit | Attending: Family Medicine | Admitting: Family Medicine

## 2023-10-11 DIAGNOSIS — Z1231 Encounter for screening mammogram for malignant neoplasm of breast: Secondary | ICD-10-CM

## 2023-10-18 ENCOUNTER — Ambulatory Visit (INDEPENDENT_AMBULATORY_CARE_PROVIDER_SITE_OTHER): Payer: 59 | Admitting: Podiatry

## 2023-10-18 ENCOUNTER — Encounter: Payer: Self-pay | Admitting: Podiatry

## 2023-10-18 VITALS — Ht 69.0 in | Wt 207.0 lb

## 2023-10-18 DIAGNOSIS — E119 Type 2 diabetes mellitus without complications: Secondary | ICD-10-CM | POA: Diagnosis not present

## 2023-10-18 DIAGNOSIS — Z794 Long term (current) use of insulin: Secondary | ICD-10-CM | POA: Diagnosis not present

## 2023-10-18 DIAGNOSIS — L84 Corns and callosities: Secondary | ICD-10-CM

## 2023-10-21 NOTE — Progress Notes (Signed)
  Subjective:  Patient ID: Kiara Kim, female    DOB: [birth year],  MRN: [id number]  Kiara Kim presents to clinic today for preventative diabetic foot care and callus(es) of both feet. Aggravating factors include weightbearing with and without shoe gear. Pain is relieved with periodic professional debridement.  Chief Complaint  Patient presents with   Nail Problem    Pt is here for [facility] last A1C was 8.2 PCP is Dr Alayne Hubert and LOV was a few months ago.   New problem(s): None.   PCP is Danella Dunn, MD.  No Known Allergies  Review of Systems: Negative except as noted in the HPI.  Objective: No changes noted in today's physical examination. There were no vitals filed for this visit. Kiara Kim is a pleasant 53 y.o. female in NAD. AAO x 3.  Vascular Examination: Capillary refill time immediate b/l. Vascular status intact b/l with palpable pedal pulses. Pedal hair present b/l. No pain with calf compression b/l. Skin temperature gradient WNL b/l. No cyanosis or clubbing b/l. No ischemia or gangrene noted b/l.   Neurological Examination: Sensation grossly intact b/l with 10 gram monofilament. Vibratory sensation intact b/l.   Dermatological Examination: Pedal skin with normal turgor, texture and tone b/l.  No open wounds. No interdigital macerations.   Toenails recently debrided.  Hyperkeratotic lesion(s) submet head 2 left foot, submet head 3 right foot, submet head 4 left foot, and submet head 5 b/l.  No erythema, no edema, no drainage, no fluctuance.  Musculoskeletal Examination: Muscle strength 5/5 to all lower extremity muscle groups bilaterally. Hammertoe deformity noted 2-5 b/l.  Radiographs: None  Assessment/Plan: 1. Callus   2. Type 2 diabetes mellitus without complication, with long-term current use of insulin (HCC)    -Patient was evaluated today. All questions/concerns addressed on today's visit. -Continue foot and shoe inspections daily. Monitor blood  glucose per PCP/Endocrinologist's recommendations. -Patient to continue soft, supportive shoe gear daily. -Callus(es) submet head 2 left foot, submet head 3 right foot, submet head 4 left foot, and submet head 5 b/l pared utilizing sterile scalpel blade without complication or incident. Total number debrided =5. -Patient/POA to call should there be question/concern in the interim.   Return in about 3 months (around 01/18/2024).  Kiara Kim, DPM      [place] LOCATION: [address], [place] [address]                   Office [phone]   [facility] LOCATION: [address] [place], [place] [address] Office [phone]

## 2023-11-06 ENCOUNTER — Telehealth: Payer: Self-pay | Admitting: Podiatry

## 2023-11-06 MED ORDER — TERBINAFINE HCL 250 MG PO TABS: 250.0000 mg | ORAL_TABLET | Freq: Every day | ORAL | 0 refills | Status: DC

## 2023-11-06 NOTE — Telephone Encounter (Signed)
 Patient called needing last refill of terbinafine  sent to [facility]. Thanks

## 2023-11-07 ENCOUNTER — Telehealth: Payer: Self-pay

## 2023-11-07 NOTE — Telephone Encounter (Signed)
 PA request received from [facility] for terbinafine  HCl 250mg  tablet. PA submitted through covermymeds and waiting on response.

## 2023-11-14 ENCOUNTER — Ambulatory Visit: Admitting: Podiatry

## 2023-11-28 ENCOUNTER — Encounter: Payer: Self-pay | Admitting: Podiatry

## 2023-11-28 ENCOUNTER — Ambulatory Visit (INDEPENDENT_AMBULATORY_CARE_PROVIDER_SITE_OTHER): Admitting: Podiatry

## 2023-11-28 VITALS — Ht 69.0 in | Wt 207.0 lb

## 2023-11-28 DIAGNOSIS — B351 Tinea unguium: Secondary | ICD-10-CM

## 2023-11-28 MED ORDER — TERBINAFINE HCL 250 MG PO TABS: 250.0000 mg | ORAL_TABLET | Freq: Every day | ORAL | 0 refills | Status: AC

## 2023-12-02 NOTE — Progress Notes (Signed)
  Subjective:  Patient ID: Kiara Kim, female    DOB: 01-14-1971,  MRN: [id number]  Chief Complaint  Patient presents with   Nail Problem    Pt is here to f/u on bilateral toenail fungus pt states she has finish her first round of Lamisil  states she see some improvement.    53 y.o. female presents with the above complaint. History confirmed with patient.  Did not have any side effects taking  Objective:  Physical Exam: warm, good capillary refill, no trophic changes or ulcerative lesions, normal DP and PT pulses, normal sensory exam, and about 20 to 30% proximal clearing         Assessment:   1. Onychomycosis       Plan:  Patient was evaluated and treated and all questions answered.  Onychomycosis - Overall doing well.  No side effects from taking Lamisil .  New photographs taken with good early clearance.  Recommended an additional course.  Refill sent to pharmacy.  Follow-up in 3 months to reevaluate.   Return in about 3 months (around 02/28/2024) for follow up after nail fungus treatment.

## 2024-03-03 ENCOUNTER — Ambulatory Visit: Admitting: Podiatry

## 2024-03-20 ENCOUNTER — Ambulatory Visit: Admitting: Podiatry

## 2024-05-08 ENCOUNTER — Ambulatory Visit (INDEPENDENT_AMBULATORY_CARE_PROVIDER_SITE_OTHER): Admitting: Podiatry

## 2024-05-08 ENCOUNTER — Encounter: Payer: Self-pay | Admitting: Podiatry

## 2024-05-08 DIAGNOSIS — E119 Type 2 diabetes mellitus without complications: Secondary | ICD-10-CM

## 2024-05-08 DIAGNOSIS — Z794 Long term (current) use of insulin: Secondary | ICD-10-CM | POA: Diagnosis not present

## 2024-05-08 DIAGNOSIS — L84 Corns and callosities: Secondary | ICD-10-CM

## 2024-05-11 NOTE — Progress Notes (Signed)
  Subjective:  Patient ID: Kiara Kim, female    DOB: 05/10/1971,  MRN: [id number]  Kiara Kim presents to clinic today for preventative diabetic foot care and callus(es) of both feet. Aggravating factors include weightbearing with and without shoe gear. Pain is relieved with periodic professional debridement. She is under care of Dr. Silva for oral therapy of onychomycosis. States she is on her second course of terbinafine . Relates no side effects from medication. Chief Complaint  Patient presents with   Nail Problem    DFC. She states her last A1c was 8.7.  She states that the are above her 5th toe right foot is very sore x 1 week. She saw Dr. Ilah mid year   New problem(s): None.   PCP is Ilah Crigler, MD.  No Known Allergies  Review of Systems: Negative except as noted in the HPI.  Objective: No changes noted in today's physical examination. There were no vitals filed for this visit. Kiara Kim is a pleasant 53 y.o. female in NAD. AAO x 3.  Vascular Examination: Capillary refill time immediate b/l. Vascular status intact b/l with palpable pedal pulses. Pedal hair present b/l. No pain with calf compression b/l. Skin temperature gradient WNL b/l. No cyanosis or clubbing b/l. No ischemia or gangrene noted b/l.   Neurological Examination: Sensation grossly intact b/l with 10 gram monofilament. Vibratory sensation intact b/l.   Dermatological Examination: Pedal skin with normal turgor, texture and tone b/l.  No open wounds. No interdigital macerations.   Toenails recently debrided.  Hyperkeratotic lesion(s) submet head 2 left foot, submet head 3 right foot, submet head 4 left foot, and submet head 5 b/l.  No erythema, no edema, no drainage, no fluctuance.  Musculoskeletal Examination: Muscle strength 5/5 to all lower extremity muscle groups bilaterally. Hammertoe deformity noted 2-5 b/l.  Radiographs: None  Assessment/Plan: 1. Callus   2. Type 2 diabetes  mellitus without complication, with long-term current use of insulin (HCC)     -Consent given for treatment as described below: -Examined patient. -Continue foot and shoe inspections daily. Monitor blood glucose per PCP/Endocrinologist's recommendations. -Patient to continue soft, supportive shoe gear daily. -Callus(es) submet head 2 left foot, submet head 3 right foot, submet head 4 left foot, submet head 5 left foot, and submet head 5 right foot pared utilizing sterile scalpel blade without complication or incident. Total number debrided =5. -Patient/POA to call should there be question/concern in the interim.   Return in about 3 months (around 08/06/2024).  Kiara Kim, DPM      [place] LOCATION: [address], [place] [address]                   Office [phone]   [facility] LOCATION: [address] [place], [place] [address] Office [phone]

## 2024-05-19 ENCOUNTER — Ambulatory Visit: Admitting: Podiatry

## 2024-05-19 VITALS — Ht 69.0 in | Wt 207.0 lb

## 2024-05-19 DIAGNOSIS — B351 Tinea unguium: Secondary | ICD-10-CM | POA: Diagnosis not present

## 2024-05-21 ENCOUNTER — Encounter: Payer: Self-pay | Admitting: Podiatry

## 2024-05-21 NOTE — Progress Notes (Signed)
°  Subjective:  Patient ID: Kiara Kim, female    DOB: 08/25/1970,  MRN: [id number]  Chief Complaint  Patient presents with   Nail Problem    RM 2 Return in about 3 months (around 02/28/2024) for follow up after nail fungus treatment. Right/Left 1st and 5th toes have some discoloration. Patient is satisfied with treatment progress.    53 y.o. female presents with the above complaint. History confirmed with patient.  Did not have any side effects taking second round doing very well  Objective:  Physical Exam: warm, good capillary refill, no trophic changes or ulcerative lesions, normal DP and PT pulses, normal sensory exam, and about 80-90 % proximal clearing.  Some residual longitudinal melanonychia       Assessment:   1. Onychomycosis        Plan:  Patient was evaluated and treated and all questions answered.  Onychomycosis Doing very well.  Would allow the nail to grow at this point do not think she needs further antifungal therapy.  There is some melanonychia which we discussed likely will not entirely resolved.  Will allow the nails to grow out advised to track with photographs, return in 6 months for reevaluation if there is still symptoms.   Return in about 6 months (around 11/17/2024) for follow up after nail fungus treatment if needed.

## 2024-09-09 ENCOUNTER — Ambulatory Visit: Admitting: Podiatry

## 2024-11-17 ENCOUNTER — Ambulatory Visit: Admitting: Podiatry
